# Patient Record
Sex: Female | Born: 1990 | Hispanic: Yes | Marital: Married | Smoking: Never smoker
Health system: Southern US, Community
[De-identification: ages and names within clinical notes are randomized; demographics above are authoritative.]

## PROBLEM LIST (undated history)

## (undated) DIAGNOSIS — Z789 Other specified health status: Secondary | ICD-10-CM

## (undated) DIAGNOSIS — O91119 Abscess of breast associated with pregnancy, unspecified trimester: Secondary | ICD-10-CM

## (undated) HISTORY — PX: NO PAST SURGERIES: SHX2092

## (undated) HISTORY — DX: Abscess of breast associated with pregnancy, unspecified trimester: O91.119

---

## 2015-01-07 NOTE — L&D Delivery Note (Signed)
Delivery Note At 1:24 AM a viable female was delivered via Vaginal, Spontaneous Delivery (Presentation: Right Occiput Anterior).  APGAR: 7, 9; weight 2682g .   Placenta status: Intact, Spontaneous.  Cord: 3 vessels with the following complications: None.  Cord pH: n/a  Anesthesia: Epidural  Episiotomy: None Lacerations: 1st degree;Perineal; right labia minora Suture Repair: 3.0 vicryl for perineal, 4.0 vicryl for labial repair Est. Blood Loss (mL): 250  Mom to postpartum.  Baby to Couplet care / Skin to Skin.  Palma HolterKanishka G Treylin Burtch 07/11/2015, 1:55 AM

## 2015-03-05 LAB — OB RESULTS CONSOLE HEPATITIS B SURFACE ANTIGEN: Hepatitis B Surface Ag: NEGATIVE

## 2015-03-05 LAB — OB RESULTS CONSOLE RPR: RPR: NONREACTIVE

## 2015-03-05 LAB — OB RESULTS CONSOLE HIV ANTIBODY (ROUTINE TESTING): HIV: NONREACTIVE

## 2015-07-10 ENCOUNTER — Inpatient Hospital Stay (HOSPITAL_COMMUNITY): Payer: Medicaid Other | Admitting: Anesthesiology

## 2015-07-10 ENCOUNTER — Encounter (HOSPITAL_COMMUNITY): Payer: Self-pay

## 2015-07-10 ENCOUNTER — Inpatient Hospital Stay (HOSPITAL_COMMUNITY)
Admission: AD | Admit: 2015-07-10 | Discharge: 2015-07-13 | DRG: 775 | Disposition: A | Discharge status: Home / Self Care | Payer: Medicaid Other | Source: Ambulatory Visit | Attending: Obstetrics & Gynecology | Admitting: Obstetrics & Gynecology

## 2015-07-10 DIAGNOSIS — Z6830 Body mass index (BMI) 30.0-30.9, adult: Secondary | ICD-10-CM | POA: Diagnosis not present

## 2015-07-10 DIAGNOSIS — O42013 Preterm premature rupture of membranes, onset of labor within 24 hours of rupture, third trimester: Secondary | ICD-10-CM | POA: Diagnosis present

## 2015-07-10 DIAGNOSIS — Z3A36 36 weeks gestation of pregnancy: Secondary | ICD-10-CM

## 2015-07-10 DIAGNOSIS — Z87891 Personal history of nicotine dependence: Secondary | ICD-10-CM

## 2015-07-10 DIAGNOSIS — E669 Obesity, unspecified: Secondary | ICD-10-CM | POA: Diagnosis present

## 2015-07-10 DIAGNOSIS — O42919 Preterm premature rupture of membranes, unspecified as to length of time between rupture and onset of labor, unspecified trimester: Secondary | ICD-10-CM | POA: Diagnosis present

## 2015-07-10 DIAGNOSIS — O99214 Obesity complicating childbirth: Secondary | ICD-10-CM | POA: Diagnosis present

## 2015-07-10 DIAGNOSIS — O99824 Streptococcus B carrier state complicating childbirth: Secondary | ICD-10-CM | POA: Diagnosis present

## 2015-07-10 HISTORY — DX: Other specified health status: Z78.9

## 2015-07-10 LAB — CBC
HEMATOCRIT: 39.4 % (ref 36.0–46.0)
HEMOGLOBIN: 13.7 g/dL (ref 12.0–15.0)
MCH: 30 pg (ref 26.0–34.0)
MCHC: 34.8 g/dL (ref 30.0–36.0)
MCV: 86.4 fL (ref 78.0–100.0)
Platelets: 251 10*3/uL (ref 150–400)
RBC: 4.56 MIL/uL (ref 3.87–5.11)
RDW: 15.3 % (ref 11.5–15.5)
WBC: 8.8 10*3/uL (ref 4.0–10.5)

## 2015-07-10 LAB — OB RESULTS CONSOLE RUBELLA ANTIBODY, IGM: RUBELLA: IMMUNE

## 2015-07-10 LAB — OB RESULTS CONSOLE RPR: RPR: NONREACTIVE

## 2015-07-10 LAB — OB RESULTS CONSOLE ABO/RH: RH TYPE: POSITIVE

## 2015-07-10 LAB — ABO/RH: ABO/RH(D): O POS

## 2015-07-10 LAB — POCT FERN TEST: POCT FERN TEST: POSITIVE

## 2015-07-10 LAB — TYPE AND SCREEN
ABO/RH(D): O POS
ANTIBODY SCREEN: NEGATIVE

## 2015-07-10 LAB — OB RESULTS CONSOLE HIV ANTIBODY (ROUTINE TESTING): HIV: NONREACTIVE

## 2015-07-10 MED ORDER — FENTANYL 2.5 MCG/ML BUPIVACAINE 1/10 % EPIDURAL INFUSION (WH - ANES)
INTRAMUSCULAR | Status: AC
  Filled 2015-07-10: qty 125

## 2015-07-10 MED ORDER — OXYTOCIN BOLUS FROM INFUSION: 500.0000 mL | INTRAVENOUS | Status: DC

## 2015-07-10 MED ORDER — TERBUTALINE SULFATE 1 MG/ML IJ SOLN
0.2500 mg | Freq: Once | INTRAMUSCULAR | Status: DC | PRN
  Filled 2015-07-10: qty 1

## 2015-07-10 MED ORDER — ACETAMINOPHEN 325 MG PO TABS: 650.0000 mg | ORAL_TABLET | ORAL | Status: DC | PRN

## 2015-07-10 MED ORDER — LACTATED RINGERS IV SOLN: 500.0000 mL | INTRAVENOUS | Status: DC | PRN

## 2015-07-10 MED ORDER — OXYTOCIN 40 UNITS IN LACTATED RINGERS INFUSION - SIMPLE MED: 2.5000 [IU]/h | INTRAVENOUS | Status: DC

## 2015-07-10 MED ORDER — CEFAZOLIN IN D5W 1 GM/50ML IV SOLN
1.0000 g | Freq: Three times a day (TID) | INTRAVENOUS | Status: DC
  Administered 2015-07-10 (×2): 1 g via INTRAVENOUS
  Filled 2015-07-10 (×4): qty 50

## 2015-07-10 MED ORDER — FENTANYL 2.5 MCG/ML BUPIVACAINE 1/10 % EPIDURAL INFUSION (WH - ANES)
14.0000 mL/h | INTRAMUSCULAR | Status: DC | PRN
  Administered 2015-07-10: 11 mL/h via EPIDURAL

## 2015-07-10 MED ORDER — LACTATED RINGERS IV SOLN
500.0000 mL | Freq: Once | INTRAVENOUS | Status: DC
Start: 2015-07-10 — End: 2015-07-11

## 2015-07-10 MED ORDER — EPHEDRINE 5 MG/ML INJ
10.0000 mg | INTRAVENOUS | Status: DC | PRN
Start: 2015-07-10 — End: 2015-07-11
  Filled 2015-07-10: qty 2

## 2015-07-10 MED ORDER — LACTATED RINGERS IV SOLN
INTRAVENOUS | Status: DC
  Administered 2015-07-10: 1000 mL via INTRAVENOUS
  Administered 2015-07-10: 23:00:00 via INTRAVENOUS

## 2015-07-10 MED ORDER — ONDANSETRON HCL 4 MG/2ML IJ SOLN: 4.0000 mg | Freq: Four times a day (QID) | INTRAMUSCULAR | Status: DC | PRN

## 2015-07-10 MED ORDER — LIDOCAINE HCL (PF) 1 % IJ SOLN
30.0000 mL | INTRAMUSCULAR | Status: AC | PRN
  Administered 2015-07-11: 30 mL via SUBCUTANEOUS
  Filled 2015-07-10: qty 30

## 2015-07-10 MED ORDER — LIDOCAINE HCL (PF) 1 % IJ SOLN
INTRAMUSCULAR | Status: DC | PRN
  Administered 2015-07-10: 3 mL via EPIDURAL
  Administered 2015-07-10: 4 mL via EPIDURAL

## 2015-07-10 MED ORDER — SOD CITRATE-CITRIC ACID 500-334 MG/5ML PO SOLN: 30.0000 mL | ORAL | Status: DC | PRN

## 2015-07-10 MED ORDER — PHENYLEPHRINE 40 MCG/ML (10ML) SYRINGE FOR IV PUSH (FOR BLOOD PRESSURE SUPPORT)
PREFILLED_SYRINGE | INTRAVENOUS | Status: DC
Start: 2015-07-10 — End: 2015-07-11
  Filled 2015-07-10: qty 20

## 2015-07-10 MED ORDER — BETAMETHASONE SOD PHOS & ACET 6 (3-3) MG/ML IJ SUSP
12.0000 mg | INTRAMUSCULAR | Status: DC
  Administered 2015-07-10: 12 mg via INTRAMUSCULAR
  Filled 2015-07-10: qty 2

## 2015-07-10 MED ORDER — DIPHENHYDRAMINE HCL 50 MG/ML IJ SOLN: 12.5000 mg | INTRAMUSCULAR | Status: DC | PRN

## 2015-07-10 MED ORDER — PHENYLEPHRINE 40 MCG/ML (10ML) SYRINGE FOR IV PUSH (FOR BLOOD PRESSURE SUPPORT)
80.0000 ug | PREFILLED_SYRINGE | INTRAVENOUS | Status: DC | PRN
Start: 2015-07-10 — End: 2015-07-11
  Filled 2015-07-10: qty 5

## 2015-07-10 MED ORDER — EPHEDRINE 5 MG/ML INJ
10.0000 mg | INTRAVENOUS | Status: DC | PRN
  Filled 2015-07-10: qty 2

## 2015-07-10 MED ORDER — PHENYLEPHRINE 40 MCG/ML (10ML) SYRINGE FOR IV PUSH (FOR BLOOD PRESSURE SUPPORT)
80.0000 ug | PREFILLED_SYRINGE | INTRAVENOUS | Status: DC | PRN
  Filled 2015-07-10: qty 5

## 2015-07-10 MED ORDER — OXYTOCIN 40 UNITS IN LACTATED RINGERS INFUSION - SIMPLE MED
1.0000 m[IU]/min | INTRAVENOUS | Status: DC
  Administered 2015-07-10: 2 m[IU]/min via INTRAVENOUS
  Administered 2015-07-10: 4 m[IU]/min via INTRAVENOUS
  Filled 2015-07-10: qty 1000

## 2015-07-10 MED ORDER — CEFAZOLIN SODIUM-DEXTROSE 2-4 GM/100ML-% IV SOLN: 2.0000 g | Freq: Once | INTRAVENOUS | Status: DC

## 2015-07-10 NOTE — Anesthesia Preprocedure Evaluation (Signed)
Anesthesia Evaluation  Patient identified by MRN, date of birth, ID band Patient awake    Reviewed: Allergy & Precautions, H&P , Patient's Chart, lab work & pertinent test results  Airway Mallampati: III  TM Distance: >3 FB Neck ROM: full    Dental no notable dental hx. (+) Teeth Intact   Pulmonary neg pulmonary ROS, former smoker,    Pulmonary exam normal breath sounds clear to auscultation       Cardiovascular negative cardio ROS Normal cardiovascular exam Rhythm:regular Rate:Normal     Neuro/Psych negative neurological ROS  negative psych ROS   GI/Hepatic negative GI ROS, Neg liver ROS,   Endo/Other  Obesity  Renal/GU negative Renal ROS  negative genitourinary   Musculoskeletal   Abdominal   Peds  Hematology   Anesthesia Other Findings   Reproductive/Obstetrics (+) Pregnancy                             Anesthesia Physical Anesthesia Plan  ASA: II  Anesthesia Plan: Epidural   Post-op Pain Management:    Induction:   Airway Management Planned:   Additional Equipment:   Intra-op Plan:   Post-operative Plan:   Informed Consent: I have reviewed the patients History and Physical, chart, labs and discussed the procedure including the risks, benefits and alternatives for the proposed anesthesia with the patient or authorized representative who has indicated his/her understanding and acceptance.     Plan Discussed with: Anesthesiologist  Anesthesia Plan Comments:         Anesthesia Quick Evaluation

## 2015-07-10 NOTE — Anesthesia Pain Management Evaluation Note (Signed)
  CRNA Pain Management Visit Note  Patient: Sherri [name], 25 y.o., female  "Hello I am a member of the anesthesia team at [facility]. We have an anesthesia team available at all times to provide care throughout the hospital, including epidural management and anesthesia for C-section. I don't know your plan for the delivery whether it a natural birth, water birth, IV sedation, nitrous supplementation, doula or epidural, but we want to meet your pain goals."   1.Was your pain managed to your expectations on prior hospitalizations?   No prior hospitalizations  2.What is your expectation for pain management during this hospitalization?     Labor support without medications  3.How can we help you reach that goal? Natural   Record the patient's initial score and the patient's pain goal.   Pain: 2  Pain Goal: 10 [facility] wants you to be able to say your pain was always managed very well.  Aymar Whitfill 07/10/2015

## 2015-07-10 NOTE — MAU Note (Signed)
Started leaking fluid at 3am this morning.  No pain

## 2015-07-10 NOTE — H&P (Signed)
LABOR AND DELIVERY ADMISSION HISTORY AND PHYSICAL NOTE  Sherri [name] is a 25 y.o. female G1P0 with IUP at 5462w5d by LMP presenting for PPROM at 430am 7/4.   She reports positive fetal movement. She denies leakage of fluid or vaginal bleeding.  Prenatal History/Complications:  Past Medical History: History reviewed. No pertinent past medical history.  Past Surgical History: History reviewed. No pertinent past surgical history.  Obstetrical History: OB History    Gravida Para Term Preterm AB TAB SAB Ectopic Multiple Living   1               Social History: Social History   Social History  . Marital Status: Single    Spouse Name: N/A  . Number of Children: N/A  . Years of Education: N/A   Social History Main Topics  . Smoking status: Former Games developermoker  . Smokeless tobacco: None  . Alcohol Use: No  . Drug Use: No  . Sexual Activity: Yes    Birth Control/ Protection: None   Other Topics Concern  . None   Social History Narrative  . None    Family History: History reviewed. No pertinent family history.  Allergies: Allergies  Allergen Reactions  . Pork-Derived Conservation officer, natureroducts Hives  . Penicillins Rash    Has patient had a PCN reaction causing immediate rash, facial/tongue/throat swelling, SOB or lightheadedness with hypotension: No Has patient had a PCN reaction causing severe rash involving mucus membranes or skin necrosis: No Has patient had a PCN reaction that required hospitalization No Has patient had a PCN reaction occurring within the last 10 years: No If all of the above answers are "NO", then may proceed with Cephalosporin use.     Prescriptions prior to admission  Medication Sig Dispense Refill Last Dose  . Prenatal Vit-Fe Fumarate-FA (PRENATAL MULTIVITAMIN) TABS tablet Take 1 tablet by mouth daily at 12 noon.   Past Week at Unknown time     Review of Systems   All systems reviewed and negative except as stated in HPI  Blood pressure 117/77, pulse  94, temperature 97.8 F (36.6 C), temperature source Oral, resp. rate 16, last menstrual period 10/26/2014. General appearance: alert, cooperative and no distress Lungs: clear to auscultation bilaterally Heart: regular rate and rhythm Abdomen: soft, non-tender; bowel sounds normal Extremities: No calf swelling or tenderness Presentation: cephalic by bedside US Fetal monitoring: reassuring category I strip Uterine activity: irregular contractions  Dilation: 1.5 Effacement (%): 50 Exam by:: Ginnie Smartachel Schmidt RN   Prenatal labs: ABO, Rh:   O pos Antibody:  negative Rubella: !Error! immune RPR:   negative HBsAg:   negative HIV:   nonreactive GBS:   unknown 1 hr Glucola: not recorded in [facility] records Genetic screening:  normal Anatomy US: 19 wk US referenced in [facility] records, no results reported  Prenatal Transfer Tool  Maternal Diabetes: No Genetic Screening: Normal Maternal Ultrasounds/Referrals: Normal Fetal Ultrasounds or other Referrals:  None Maternal Substance Abuse:  No Significant Maternal Medications:  None Significant Maternal Lab Results: Lab values include: Group B Strep positive, Other: GBS unknown, history of anemia  Results for orders placed or performed during the hospital encounter of 07/10/15 (from the past 24 hour(s))  Fern Test   Collection Time: 07/10/15  3:01 PM  Result Value Ref Range   POCT Fern Test Positive = ruptured amniotic membanes     Patient Active Problem List   Diagnosis Date Noted  . Preterm premature rupture of membranes (PPROM) delivered, current hospitalization 07/10/2015  Assessment: Sherri [name] is a 25 y.o. G1P0 at 6942w5d here for PPROM since 46430 7/4.   #Labor: foley bulb, pit #Pain: None at admission, undecided on pain relief plans #FWB: Reassuring category I strip #ID:  GBS unk - ancef #MOF: breast #MOC:undecided #Circ:  Undecided #pprom: bmz now and in 24 hours, will hold if triple i diagnosed  Loni MuseKate  Timberlake 07/10/2015, 3:55 PM    OB FELLOW HISTORY AND PHYSICAL ATTESTATION  I have seen and examined this patient; I agree with above documentation in the resident's note.    Cherrie Gauzeoah B Danelia Snodgrass 07/10/2015, 4:26 PM

## 2015-07-10 NOTE — Anesthesia Procedure Notes (Signed)
Epidural Patient location during procedure: OB Start time: 07/10/2015 11:02 PM  Staffing Anesthesiologist: Mal AmabileFOSTER, Khair Chasteen Performed by: anesthesiologist   Preanesthetic Checklist Completed: patient identified, site marked, surgical consent, pre-op evaluation, timeout performed, IV checked, risks and benefits discussed and monitors and equipment checked  Epidural Patient position: sitting Prep: site prepped and draped and DuraPrep Patient monitoring: continuous pulse ox and blood pressure Approach: midline Location: L3-L4 Injection technique: LOR air  Needle:  Needle type: Tuohy  Needle gauge: 17 G Needle length: 9 cm and 9 Needle insertion depth: 5 cm cm Catheter type: closed end flexible Catheter size: 19 Gauge Catheter at skin depth: 10 cm Test dose: negative and Other  Assessment Events: blood not aspirated, injection not painful, no injection resistance, negative IV test and no paresthesia  Additional Notes Patient identified. Risks and benefits discussed including failed block, incomplete  Pain control, post dural puncture headache, nerve damage, paralysis, blood pressure Changes, nausea, vomiting, reactions to medications-both toxic and allergic and post Partum back pain. All questions were answered. Patient expressed understanding and wished to proceed. Sterile technique was used throughout procedure. Epidural site was Dressed with sterile barrier dressing. No paresthesias, signs of intravascular injection Or signs of intrathecal spread were encountered.  Patient was more comfortable after the epidural was dosed. Please see RN's note for documentation of vital signs and FHR which are stable.

## 2015-07-10 NOTE — Progress Notes (Signed)
Interpreter in room

## 2015-07-10 NOTE — Progress Notes (Signed)
Labor Progress Note Sherri [name] is a 25 y.o. G1P0 at 2244w5d presented for PPROM since 0430 7/4.    O:  BP 120/82 mmHg  Pulse 86  Temp(Src) 98.7 F (37.1 C) (Oral)  Resp 18  Ht 5' 0.24" (1.53 m)  Wt 71.668 kg (158 lb)  BMI 30.62 kg/m2  LMP 10/26/2014 (Exact Date) EFM: HR 135, mod variability, one mild variable, 15x15 accels  CVE: Dilation: 4.5 Effacement (%): 70 Cervical Position: Middle Station: -2 Presentation: Vertex Exam by:: Sherri ShirtsV Rogers RN    A&P: 25 y.o. G1P0 3944w5d PPROM at 530430 7/4 #Labor: s/p foley bulb; on pitocin  #Pain: does not desire medications  #FWB: cat 1 #GBS: unknown > Ancef due to allergy to PCN  Palma HolterKanishka G Gunadasa, MD 10:30 PM

## 2015-07-10 NOTE — Progress Notes (Signed)
Dr Ashok PallWouk [place], procedures to pt.

## 2015-07-11 ENCOUNTER — Encounter (HOSPITAL_COMMUNITY): Payer: Self-pay | Admitting: Certified Nurse Midwife

## 2015-07-11 DIAGNOSIS — Z3A36 36 weeks gestation of pregnancy: Secondary | ICD-10-CM

## 2015-07-11 DIAGNOSIS — O42013 Preterm premature rupture of membranes, onset of labor within 24 hours of rupture, third trimester: Secondary | ICD-10-CM

## 2015-07-11 MED ORDER — ACETAMINOPHEN 325 MG PO TABS: 650.0000 mg | ORAL_TABLET | ORAL | Status: DC | PRN

## 2015-07-11 MED ORDER — WITCH HAZEL-GLYCERIN EX PADS: 1.0000 "application " | MEDICATED_PAD | CUTANEOUS | Status: DC | PRN

## 2015-07-11 MED ORDER — ZOLPIDEM TARTRATE 5 MG PO TABS: 5.0000 mg | ORAL_TABLET | Freq: Every evening | ORAL | Status: DC | PRN

## 2015-07-11 MED ORDER — COCONUT OIL OIL: 1.0000 "application " | TOPICAL_OIL | Status: DC | PRN

## 2015-07-11 MED ORDER — TETANUS-DIPHTH-ACELL PERTUSSIS 5-2.5-18.5 LF-MCG/0.5 IM SUSP
0.5000 mL | Freq: Once | INTRAMUSCULAR | Status: AC
  Administered 2015-07-12: 0.5 mL via INTRAMUSCULAR
  Filled 2015-07-11: qty 0.5

## 2015-07-11 MED ORDER — ONDANSETRON HCL 4 MG/2ML IJ SOLN: 4.0000 mg | INTRAMUSCULAR | Status: DC | PRN

## 2015-07-11 MED ORDER — PRENATAL MULTIVITAMIN CH
1.0000 | ORAL_TABLET | Freq: Every day | ORAL | Status: DC
  Administered 2015-07-11 – 2015-07-13 (×3): 1 via ORAL
  Filled 2015-07-11 (×3): qty 1

## 2015-07-11 MED ORDER — ONDANSETRON HCL 4 MG PO TABS: 4.0000 mg | ORAL_TABLET | ORAL | Status: DC | PRN

## 2015-07-11 MED ORDER — DIBUCAINE 1 % RE OINT: 1.0000 "application " | TOPICAL_OINTMENT | RECTAL | Status: DC | PRN

## 2015-07-11 MED ORDER — SENNOSIDES-DOCUSATE SODIUM 8.6-50 MG PO TABS
2.0000 | ORAL_TABLET | ORAL | Status: DC
  Administered 2015-07-12 (×2): 2 via ORAL
  Filled 2015-07-11 (×2): qty 2

## 2015-07-11 MED ORDER — DIPHENHYDRAMINE HCL 25 MG PO CAPS: 25.0000 mg | ORAL_CAPSULE | Freq: Four times a day (QID) | ORAL | Status: DC | PRN

## 2015-07-11 MED ORDER — IBUPROFEN 600 MG PO TABS
600.0000 mg | ORAL_TABLET | Freq: Four times a day (QID) | ORAL | Status: DC
  Administered 2015-07-11 – 2015-07-13 (×10): 600 mg via ORAL
  Filled 2015-07-11 (×10): qty 1

## 2015-07-11 MED ORDER — SIMETHICONE 80 MG PO CHEW: 80.0000 mg | CHEWABLE_TABLET | ORAL | Status: DC | PRN

## 2015-07-11 MED ORDER — BENZOCAINE-MENTHOL 20-0.5 % EX AERO
1.0000 "application " | INHALATION_SPRAY | CUTANEOUS | Status: DC | PRN
  Filled 2015-07-11: qty 56

## 2015-07-11 NOTE — Lactation Note (Signed)
This note was copied from a baby's chart. Lactation Consultation Note  Patient Name: Sherri [name] YNWGN'FToday's Date: 07/11/2015 Reason for consult: Initial assessment;Infant < 6lbs;Late preterm infant Baby sleepy at this visit. Attempted to wake to latch but baby too sleepy, would take few suckles on LC finger then fall asleep. Placed baby STS on Mom. Basic teaching reviewed with parents. LPT policy discussed and hand out given to review. Encouraged to BF with feeding ques but at least every 3 hours. Attempted hand expression at this visit, received 1 drop of colostrum. Baby had 2 ml of colostrum around 12:00 per chart. Lactation brochure left for review. Advised of OP services and support group. Will return around 1500 to attempt latch again, Mom to leave baby  STS till that time. If baby not waking to BF and not able to obtain colostrum via hand expression/pump, discussed with parents supplementing according to LPT guidelines. [employer] [phone] used for visit.   Maternal Data Has patient been taught Hand Expression?: Yes Does the patient have breastfeeding experience prior to this delivery?: No  Feeding Feeding Type: Breast Fed Length of feed: 0 min  LATCH Score/Interventions Latch: Too sleepy or reluctant, no latch achieved, no sucking elicited.  Audible Swallowing: None  Type of Nipple: Everted at rest and after stimulation  Comfort (Breast/Nipple): Soft / non-tender     Hold (Positioning): Assistance needed to correctly position infant at breast and maintain latch. Intervention(s): Breastfeeding basics reviewed;Support Pillows;Position options;Skin to skin  LATCH Score: 5  Lactation Tools Discussed/Used Tools: Pump Breast pump type: Double-Electric Breast Pump WIC Program: Yes Pump Review: Setup, frequency, and cleaning Initiated by:: LC Date initiated:: 07/11/15   Consult Status Consult Status: Follow-up Date: 07/11/15 Follow-up type:  In-patient    Sherri [name], Sherri [name] 07/11/2015, 2:09 PM

## 2015-07-11 NOTE — Lactation Note (Signed)
This note was copied from a baby's chart. Lactation Consultation Note  Patient Name: Sherri Orson AloeMergeri Diaz Barrios IONGE'XToday's Date: 07/11/2015 Reason for consult: Follow-up assessment;Infant < 6lbs;Late preterm infant [employer] [phone] - Mom pre-pumped with hand pump to help with latch, small amount of aerola edema present. LC demonstrated awakening techniques, assisted Mom with positioning and latching baby to left breast. After few attempts and some suck training baby latched and developed a good suckling rhythm, swallowing motions noted. After the feeding, assisted Mom with hand expression and finger feeding baby back approx 1 ml of colostrum. Discussed supplementing since baby sleepy and LPT, demonstrated to FOB how to finger feed using curved tipped syringe, FOB demonstrated back but reports he would feel better using a bottle. Advised to call RN for assist with 1st bottle after next BF. Encouraged supplementing due to LPT and baby very sleepy at breast, <6 lbs. Plan discussed with Mom is to BF each feeding 1 breast for up to 30 minutes, FOB to give supplement per LPT guidelines while Mom post pumps for 15 minutes. Alternate breast each feeding.  Parents report understanding plan. RN aware of plan discussed with parents.  Encouraged to call for assist with feedings, wake baby to BF if not giving feeding ques by 3 hours from last feeding. Keep baby STS as much as Mom can. Call for assist as needed, questions/concerns.   Maternal Data Has patient been taught Hand Expression?: Yes Does the patient have breastfeeding experience prior to this delivery?: No  Feeding Feeding Type: Formula Length of feed: 5 min  LATCH Score/Interventions Latch: Repeated attempts needed to sustain latch, nipple held in mouth throughout feeding, stimulation needed to elicit sucking reflex. Intervention(s): Adjust position;Assist with latch;Breast massage;Breast compression  Audible Swallowing: A few with  stimulation  Type of Nipple: Everted at rest and after stimulation  Comfort (Breast/Nipple): Soft / non-tender     Hold (Positioning): Assistance needed to correctly position infant at breast and maintain latch. Intervention(s): Breastfeeding basics reviewed;Support Pillows;Position options;Skin to skin  LATCH Score: 7  Lactation Tools Discussed/Used Tools: Pump Breast pump type: Double-Electric Breast Pump WIC Program: Yes Pump Review: Setup, frequency, and cleaning Initiated by:: RN Date initiated:: 07/11/15   Consult Status Consult Status: Follow-up Date: 07/12/15 Follow-up type: In-patient    Alfred LevinsGranger, Denisia Harpole Ann 07/11/2015, 3:55 PM

## 2015-07-11 NOTE — Progress Notes (Signed)
I stopped to check on patients need I oredered her meals, by Orlan LeavensViria Alvarez Spanish Interpreter.

## 2015-07-11 NOTE — Anesthesia Postprocedure Evaluation (Signed)
Anesthesia Post Note  Patient: Sherri [name]  Procedure(s) Performed: * No procedures listed *  Patient location during evaluation: Mother Baby Anesthesia Type: Epidural Level of consciousness: awake and alert Pain management: pain level controlled Vital Signs Assessment: post-procedure vital signs reviewed and stable Respiratory status: spontaneous breathing, nonlabored ventilation and respiratory function stable Cardiovascular status: stable Postop Assessment: no headache, no backache and epidural receding Anesthetic complications: no     Last Vitals:  Filed Vitals:   07/11/15 0420 07/11/15 0800  BP: 110/70 107/59  Pulse: 110 96  Temp: 37.1 C 36.9 C  Resp: 18 18    Last Pain:  Filed Vitals:   07/11/15 0849  PainSc: 0-No pain   Pain Goal:                 Junious SilkGILBERT,Norell Brisbin

## 2015-07-12 LAB — RPR: RPR Ser Ql: NONREACTIVE

## 2015-07-12 NOTE — Progress Notes (Signed)
Post Partum Day 1  Subjective:  Sherri [name] is a 25 y.o. G1P0101 8475w6d s/p SVD.  No acute events overnight.  Pt denies problems with ambulating, voiding or po intake.  She denies nausea or vomiting.  Pain is well controlled.  She has had flatus. She has not had bowel movement.  Lochia Small.  Plan for birth control is IUD.  Method of Feeding: breast  Objective: BP 109/67 mmHg  Pulse 98  Temp(Src) 97.9 F (36.6 C) (Oral)  Resp 18  Ht 5' 0.24" (1.53 m)  Wt 71.668 kg (158 lb)  BMI 30.62 kg/m2  SpO2 99%  LMP 10/26/2014 (Exact Date)  Breastfeeding? Unknown  Physical Exam:  General: alert, cooperative and no distress Lochia:normal flow Chest: CTAB Heart: RRR no m/r/g Abdomen: +BS, soft, nontender, fundus firm at/below umbilicus Uterine Fundus: firm, nontender DVT Evaluation: No evidence of DVT seen on physical exam. Extremities: no edema   Recent Labs  07/10/15 1633  HGB 13.7  HCT 39.4    Assessment/Plan:  ASSESSMENT: Sherri [name] is a 25 y.o. G1P0101 5975w6d ppd #1 s/p NSVD doing well.   Plan for discharge tomorrow and Breastfeeding   LOS: 2 days   Loni MuseKate Timberlake 07/12/2015, 8:02 AM    CNM attestation Post Partum Day #1 I have seen and examined this patient and agree with above documentation in the resident's note.   Sherri [name] is a 25 y.o. G1P0101 s/p SVD.  Pt denies problems with ambulating, voiding or po intake. Pain is well controlled.  Plan for birth control is IUD.  Method of Feeding: breast  PE:  BP 109/67 mmHg  Pulse 98  Temp(Src) 97.9 F (36.6 C) (Oral)  Resp 18  Ht 5' 0.24" (1.53 m)  Wt 71.668 kg (158 lb)  BMI 30.62 kg/m2  SpO2 99%  LMP 10/26/2014 (Exact Date)  Breastfeeding? Unknown Fundus firm  Plan for discharge: 07/13/15  Cam HaiSHAW, Brittain Smithey, CNM 9:15 AM  07/12/2015

## 2015-07-12 NOTE — Progress Notes (Signed)
I assisted FP with explanation of care plan.  Sherri [name]  Interpreter. °

## 2015-07-13 MED ORDER — IBUPROFEN 600 MG PO TABS: 600.0000 mg | ORAL_TABLET | Freq: Four times a day (QID) | ORAL | Status: DC

## 2015-07-13 MED ORDER — ACETAMINOPHEN 325 MG PO TABS: 650.0000 mg | ORAL_TABLET | ORAL | Status: DC | PRN

## 2015-07-13 NOTE — Lactation Note (Signed)
This note was copied from a baby's chart. Lactation Consultation Note Moms breast are filling. Massage breast and hand expressed easily colostrum.set up DEBP to relieve filling. Collected 5 colostrum. Gave to baby first, then formula subtract according to LPI feeding sheet,. Interpreter present. Parents states understand.  Patient Name: Sherri Orson AloeMergeri Diaz Barrios JWJXB'JToday's Date: 07/13/2015 Reason for consult: Follow-up assessment;Infant < 6lbs;Late preterm infant   Maternal Data    Feeding Feeding Type: Breast Milk with Formula added Nipple Type: Slow - flow  LATCH Score/Interventions Intervention(s): Adjust position;Assist with latch;Breast massage;Breast compression  Intervention(s): Skin to skin;Hand expression Intervention(s): Alternate breast massage  Type of Nipple: Everted at rest and after stimulation  Comfort (Breast/Nipple): Soft / non-tender     Intervention(s): Position options;Support Pillows;Breastfeeding basics reviewed;Skin to skin     Lactation Tools Discussed/Used Tools: Pump;Bottle Breast pump type: Double-Electric Breast Pump   Consult Status Consult Status: Follow-up Date: 07/14/15 Follow-up type: In-patient    Charyl DancerCARVER, Rhyan Wolters G 07/13/2015, 6:21 AM

## 2015-07-13 NOTE — Lactation Note (Signed)
This note was copied from a baby's chart. Lactation Consultation Note Baby 51 hrs old had 3% weight loss. Mom BF first then giving formula as supplement. Had good I&O. FOB states mom has a little bit more colostrum, but not enough to BF baby w/o formula. Baby had 10 voids and 11 stools since birth. Interpreter to assist in question for me.  Patient Name: Sherri [name] Reason for consult: Follow-up assessment;Infant < 6lbs;Late preterm infant   Maternal Data    Feeding    LATCH Score/Interventions                      Lactation Tools Discussed/Used     Consult Status Consult Status: Follow-up Date: 07/14/15 Follow-up type: In-patient    [name], [name], 5:28 AM

## 2015-07-13 NOTE — Lactation Note (Signed)
This note was copied from a baby's chart. Lactation Consultation Note  [employer]da Interpreter present. Mother states she is pumping in addition to formula feeding because baby is still having difficulty latching. She recently pumped 8 ml and gave to baby with an additional 20 ml of formula. Parents state they have volume guidelines. Encouraged mother to continue trying to breastfeed and pump. Suggest she call if she would like assistance.  Patient Name: Sherri [name]: 07/13/2015 Reason for consult: Follow-up assessment   Maternal Data    Feeding Feeding Type: Breast Milk with Formula added Nipple Type: Slow - flow  LATCH Score/Interventions                      Lactation Tools Discussed/Used     Consult Status Consult Status: Follow-up [name]: 07/14/15 Follow-up type: In-patient    [name], [name] 07/13/2015, 1:22 PM

## 2015-07-13 NOTE — Discharge Summary (Signed)
OB Discharge Summary     Patient Name: Sherri [name] DOB: [birth year] MRN: [id number]  Date of admission: 07/10/2015 Delivering MD: Palma HolterGUNADASA, KANISHKA G   Date of discharge: 07/13/2015  Admitting diagnosis: 36WKS LEAKING FLUID Intrauterine pregnancy: 4847w6d     Secondary diagnosis:  Active Problems:   Preterm premature rupture of membranes (PPROM) delivered, current hospitalization  Additional problems: none     Discharge diagnosis: Term Pregnancy Delivered                                                                                                Post partum procedures: none  Augmentation: Pitocin and Foley Balloon  Complications: None  Hospital course:  Onset of Labor With Vaginal Delivery     25 y.o. yo G1P0101 at 8447w6d was admitted with prom on 07/10/2015. Patient had an uncomplicated labor course as follows:  Membrane Rupture Time/Date: 4:30 PM ,07/11/2015   Intrapartum Procedures: Episiotomy: None [1]                                         Lacerations:  1st degree [2];Perineal [11]  Patient had a delivery of a Viable infant. 07/11/2015  Information for the patient's newborn:  Oretha EllisDiaz [name], Boy Sherri [name] [[id number]][[id number]]  Delivery Method: Vaginal, Spontaneous Delivery (Filed from Delivery Summary)    Pateint had an uncomplicated postpartum course.  She is ambulating, tolerating a regular diet, passing flatus, and urinating well. Patient is discharged home in stable condition on 07/13/2015.    Physical exam  Filed Vitals:   07/11/15 1830 07/12/15 0658 07/12/15 1810 07/13/15 0600  BP: 102/63 109/67 108/60 100/71  Pulse: 113 98 107 90  Temp: 97.9 F (36.6 C) 97.9 F (36.6 C) 98.2 F (36.8 C) 98.2 F (36.8 C)  TempSrc: Oral Oral Oral   Resp: 18 18 16 18   Height:      Weight:      SpO2: 99%  100%    General: alert, cooperative and no distress Lochia: appropriate Uterine Fundus: firm Incision: N/A DVT Evaluation: No cords or calf tenderness. No significant  calf/ankle edema. Labs: Lab Results  Component Value Date   WBC 8.8 07/10/2015   HGB 13.7 07/10/2015   HCT 39.4 07/10/2015   MCV 86.4 07/10/2015   PLT 251 07/10/2015   No flowsheet data found.  Discharge instruction: per After Visit Summary and "Baby and Me Booklet".  After visit meds:    Medication List    TAKE these medications        acetaminophen 325 MG tablet  Commonly known as:  TYLENOL  Take 2 tablets (650 mg total) by mouth every 4 (four) hours as needed (for pain scale < 4ORtemperature>/=100.5 F).     ibuprofen 600 MG tablet  Commonly known as:  ADVIL,MOTRIN  Take 1 tablet (600 mg total) by mouth every 6 (six) hours.     prenatal multivitamin Tabs tablet  Take 1 tablet by mouth daily at 12 noon.  Diet: routine diet  Activity: Advance as tolerated. Pelvic rest for 6 weeks.   Outpatient follow up:6 weeks Follow up Appt:No future appointments. Follow up Visit:No Follow-up on file.  Postpartum contraception: Undecided  Newborn Data: Live born female  Birth Weight: 5 lb 14.6 oz (2682 g) APGAR: 7, 9  Baby Feeding: Breast Disposition:home with mother   07/13/2015 Silvano BilisNoah B Chaundra Abreu, MD

## 2015-07-13 NOTE — Discharge Instructions (Signed)
Parto vaginal, Cuidados posteriores  °(Vaginal Delivery, Care After) °Siga estas instrucciones durante las próximas semanas. Estas indicaciones para el alta le proporcionan información general acerca de cómo deberá cuidarse después del parto. El médico también podrá darle instrucciones específicas. El tratamiento ha sido planificado según las prácticas médicas actuales, pero en algunos casos pueden ocurrir problemas. Comuníquese con el médico si tiene algún problema o tiene preguntas al volver a su casa.  °INSTRUCCIONES PARA EL CUIDADO EN EL HOGAR  °· Tome sólo medicamentos de venta libre o recetados, según las indicaciones del médico o del farmacéutico. °· No beba alcohol, especialmente si está amamantando o toma analgésicos. °· No mastique tabaco ni fume. °· No consuma drogas. °· Continúe con un adecuado cuidado perineal. El buen cuidado perineal incluye: °¨ Higienizarse de adelante hacia atrás. °¨ Mantener la zona perineal limpia. °· No use tampones ni duchas vaginales hasta que su médico la autorice. °· Dúchese, lávese el cabello y tome baños de inmersión según las indicaciones de su médico. °· Utilice un sostén que le ajuste bien y que brinde buen soporte a sus mamas. °· Consuma alimentos saludables. °· Beba suficiente líquido para mantener la orina clara o de color amarillo pálido. °· Consuma alimentos ricos en fibra como cereales y panes integrales, arroz, frijoles y frutas y verduras frescas todos los días. Estos alimentos pueden ayudarla a prevenir o aliviar el estreñimiento. °· Siga las recomendaciones de su médico relacionadas con la reanudación de actividades como subir escaleras, conducir automóviles, levantar objetos, hacer ejercicios o viajar. °· Hable con su médico acerca de reanudar la actividad sexual. Volver a la actividad sexual depende del riesgo de infección, la velocidad de la curación y la comodidad y su deseo de reanudarla. °· Trate de que alguien la ayude con las actividades del hogar y con  el recién nacido al menos durante un par de días después de salir del hospital. °· Descanse todo lo que pueda. Trate de descansar o tomar una siesta mientras el bebé está durmiendo. °· Aumente sus actividades gradualmente. °· Cumpla con todas las visitas de control programadas para después del parto. Es muy importante asistir a todas las citas programadas de seguimiento. En estas citas, su médico va a controlarla para asegurarse de que esté sanando física y emocionalmente. °SOLICITE ATENCIÓN MÉDICA SI:  °· Elimina coágulos grandes por la vagina. Guarde algunos coágulos para mostrarle al médico. °· Tiene una secreción con feo olor que proviene de la vagina. °· Tiene dificultad para orinar. °· Orina con frecuencia. °· Siente dolor al orinar. °· Nota un cambio en sus movimientos intestinales. °· Aumenta el enrojecimiento, el dolor o la hinchazón en la zona de la incisión vaginal (episiotomía) o el desgarro vaginal. °· Tiene pus que drena por la episiotomía o el desgarro vaginal. °· La episiotomía o el desgarro vaginal se abren. °· Sus mamas le duelen, están duras o enrojecidas. °· Sufre un dolor intenso de cabeza. °· Tiene visión borrosa o ve manchas. °· Se siente triste o deprimida. °· Tiene pensamientos acerca de lastimarse o dañar al recién nacido. °· Tiene preguntas acerca de su cuidado personal, el cuidado del recién nacido o acerca de los medicamentos. °· Se siente mareada o sufre un desmayo. °· Tiene una erupción. °· Tiene náuseas o vómitos. °· Usted amamantó al bebé y no ha tenido su período menstrual dentro de las 12 semanas después de dejar de amamantar. °· No amamanta al bebé y no tuvo su período menstrual en las últimas 12° semanas después del   parto. °· Tiene fiebre. °SOLICITE ATENCIÓN MÉDICA DE INMEDIATO SI:  °· Siente dolor persistente. °· Siente dolor en el pecho. °· Le falta el aire. °· Se desmaya. °· Siente dolor en la pierna. °· Siente dolor en el estómago. °· El sangrado vaginal satura dos o más  apósitos en 1 hora. °  °Esta información no tiene como fin reemplazar el consejo del médico. Asegúrese de hacerle al médico cualquier pregunta que tenga. °  °Document Released: 12/23/2004 Document Revised: 09/13/2014 °Elsevier Interactive Patient Education ©2016 Elsevier Inc. ° °

## 2015-07-14 ENCOUNTER — Ambulatory Visit: Payer: Self-pay

## 2015-07-14 NOTE — Lactation Note (Signed)
This note was copied from a baby's chart. Lactation Consultation Note  Follow up visit made.  Mom is giving baby a bottle of formula.  Baby is 79 hours and mom's breasts are very full.  She states she last pumped 2 hours ago and obtained 20 mls.  Instructed to change pump setting to standard and pump for 20 minutes.  Instructed to pump every 2-3 hours today.  Reviewed importance of baby receiving breast milk over formula when available.  Instructed to give 30-45 mls per feeding today.  Discussed WIC loaner and mom interested in loaner before discharage.  [facility]  I put pump pieces together and encouraged her to pump when baby finishes bottle.  I will follow up  Patient Name: Boy Deepika Oretha EllisDiaz Barrios ZOXWR'UToday's Date: 07/14/2015     Maternal Data    Feeding    LATCH Score/Interventions                      Lactation Tools Discussed/Used     Consult Status      Huston FoleyMOULDEN, Kristofer Schaffert S 07/14/2015, 9:15 AM

## 2015-07-17 ENCOUNTER — Ambulatory Visit (HOSPITAL_COMMUNITY)
Admission: RE | Admit: 2015-07-17 | Discharge: 2015-07-17 | Disposition: A | Discharge status: Home / Self Care | Payer: Medicaid Other | Source: Ambulatory Visit | Attending: Obstetrics and Gynecology | Admitting: Obstetrics and Gynecology

## 2015-07-17 NOTE — Lactation Note (Signed)
Lactation Consult  Mother's reason for visit:  Help with latch Visit Type:  Outpatient Appointment Notes:  [employer] Video Interpreter [phone]#[id number] [facility] used for [facility]. Mom here for help with latch, she is currently pump and bottle feeding. [facility] but did not bring in today. [facility] now that [facility].  Consult:  Initial Lactation Consultant:  Alfred LevinsGranger, Yeraldine Forney Ann  ________________________________________________________________________  Baby's Name: Carloyn JaegerJowell Abdiel Pez Diaz Date of Birth: 07/11/2013 [facility] Gender: female Gestational Age: [id number] (At Birth) Birth Weight: 5 lb 14.6 oz (2682 g) Weight at Discharge: Weight: 5 lb 9.2 oz (2530 g)Date of Discharge: 07/14/2015 Filed Weights   07/12/15 0003 07/12/15 2350 07/14/15 0105  Weight: 5 lb 9.4 oz (2534 g) 5 lb 9.4 oz (2535 g) 5 lb 9.2 oz (2530 g)   [facility] 07/17/15 5 lb. 12.8 oz Location:Smart start Weight today: 5 lb. 11.8 oz/2602 gm with clean diaper, Baby now 36 days old.      ________________________________________________________________________  Mother's Name: Orson AloeMergeri Diaz Barrios Type of delivery:  SVB Breastfeeding Experience:  P1 Maternal Medical Conditions:  N/A Maternal Medications:  Motrin  ________________________________________________________________________  Breastfeeding History (Post Discharge)  Frequency of breastfeeding:  Baby is not latching  Mom has [employer] (WIC Loaner) and Mom reports she pumps every 3 hours receiving 35-40 ml of EBM. Mom is giving EBM 35-40 ml back to baby via bottle every 2-3 hours.   Infant Intake and Output Assessment  Voids:  4-5 in 24 hrs.  Color:  Clear yellow Stools:  4 in 24 hrs.  Color:   Yellow  ________________________________________________________________________  Maternal Breast Assessment  Breast:  Full Nipple:  Erect Pain level:  1, no trauma observed on nipples  Feeding Assessment/Evaluation  Initial feeding assessment:  Infant's oral assessment:  Variance. Thick, short labial frenulum. Sucking callous along upper/lower lips.  Positioning:  Football Right breast  Mom's breast very full, nipple/aerola firm. Assisted Mom with hand expression to soften nipple/aerola. After this baby latched with using breast compression, LC demonstrated how to un-tuck the lips for more depth. Baby demonstrated some good suckling burst with swallows present. PS with initial latch was 1-2 resolving to 0 with baby nursing. Baby becomes sleepy during the feeding and lots of stimulation needed to keep awake at breast.   Pre-feed weight:  2602 g  (5 lb. 11.8 oz.) Post-feed weight:  2622 g (5 lb. 12.5 oz.) Amount transferred:  20 ml with nursing for 15 minutes  On left breast, hand expressed again to soften nipple/aerola to help with latch. Assisted Mom in cross cradle hold. Few attempts needed and help un-tucking lips for baby to obtain good depth. Good suckling bursts observed but baby quickly becomes sleepy at breast. Baby nursed for 5 minutes transferring an additional 10 ml.  After this LC assisted Mom with hand expression to soften breasts, some nodules noted in right breast outer quadrants. LC stressed importance to Mom of these areas softening with nursing or pumping to prevent clogged milk duct or mastitis. Received an additional 20 of breast milk with hand expression and MGM gave this to baby via bottle.  Total amount transferred:  30 ml Total supplement given:  20 ml EBM  Feeding plan discussed with Mom: BF with each feeding 8-12 times or more in 24 hours Pre-pump for 3-5 minutes to soften  aerola and remove lower fat milk so baby gets to higher fat milk with nursing. Give  this back to baby with supplement at end of feeding. BF 15-20 minutes, both breasts each feeding if possible. Pump for 15-20 minutes after each feeding to empty breast well and to have EBM to supplement. Supplement with 30 ml or more if needed to satisfy baby after each feeding. Let family member help with giving supplement.  Our goal is to improve baby's ability to transfer milk by offering breast with each feeding so Mom can stop pumping/supplementing each feeding and just BF her baby.  If baby does not go to breast and Mom pump/bottle then give 60 ml EBM with feeding. Offered OP f/u, Mom will call if needed. Encouraged support group.

## 2019-08-08 ENCOUNTER — Telehealth: Payer: Self-pay

## 2019-08-08 ENCOUNTER — Ambulatory Visit
Admission: EM | Admit: 2019-08-08 | Discharge: 2019-08-08 | Disposition: A | Discharge status: Home / Self Care | Payer: Self-pay | Attending: Physician Assistant | Admitting: Physician Assistant

## 2019-08-08 ENCOUNTER — Other Ambulatory Visit: Payer: Self-pay

## 2019-08-08 DIAGNOSIS — S21002A Unspecified open wound of left breast, initial encounter: Secondary | ICD-10-CM

## 2019-08-08 DIAGNOSIS — N6320 Unspecified lump in the left breast, unspecified quadrant: Secondary | ICD-10-CM

## 2019-08-08 MED ORDER — MUPIROCIN 2 % EX OINT: 1.0000 "application " | TOPICAL_OINTMENT | Freq: Two times a day (BID) | CUTANEOUS | 0 refills | Status: DC

## 2019-08-08 NOTE — Telephone Encounter (Signed)
Telephoned patient at home number (using interpreter#[phone])) left voice message with BCCCP contact information.

## 2019-08-08 NOTE — Discharge Instructions (Signed)
Keep open wound clean and dry. Clean gently with soap and water. Bactroban ointment with gauze to prevent infection and further friction. You will be contacted to schedule for mammogram/ultrasound. Please make appointment with GYN for follow up was well.

## 2019-08-08 NOTE — ED Provider Notes (Signed)
[facility]    CSN: [id number] Arrival date & time: 08/08/19  1046      History   Chief Complaint Chief Complaint  Patient presents with  . Breast Pain    HPI Sherri [name] is a 29 y.o. female.   29 year old female comes in for 1-2 month history of left breast mass with pain. HPI obtained by patient through video translator. States first noticed a lump/knot to the area that then self erupted with drainage. Has been drainage to the past 2-3 weeks now with erythema, warmth, pain. Denies fever, nipple discharge. No personal or family history of breast cancer. Not breast feeding. LMP 07/24/2019.  States finished antibiotics [place] 1 [place].      Past Medical History:  Diagnosis Date  . Medical history non-contributory     Patient Active Problem List   Diagnosis Date Noted  . Preterm premature rupture of membranes (PPROM) delivered, current hospitalization 07/10/2015    History reviewed. No pertinent surgical history.  OB History    Gravida  1   Para  1   Term      Preterm  1   AB      Living  1     SAB      TAB      Ectopic      Multiple  0   Live Births  1            Home Medications    Prior to Admission medications   Medication Sig Start Date End Date Taking? Authorizing Provider  mupirocin ointment (BACTROBAN) 2 % Apply 1 application topically 2 (two) times daily. 08/08/19   Belinda Fisher, PA-C    Family History No family history on file.  Social History Social History   Tobacco Use  . Smoking status: Former Games developer  . Smokeless tobacco: Never Used  Substance Use Topics  . Alcohol use: No  . Drug use: No     Allergies   Pork-derived products and Penicillins   Review of Systems Review of Systems  Reason unable to perform ROS: See HPI as above.     Physical Exam Triage Vital Signs ED Triage Vitals [08/08/19 1110]  Enc Vitals Group     BP 101/69     Pulse Rate 68     Resp  16     Temp (!) 97.4 F (36.3 C)     Temp Source Oral     SpO2 99 %     Weight      Height      Head Circumference      Peak Flow      Pain Score      Pain Loc      Pain Edu?      Excl. in GC?    No data found.  Updated Vital Signs BP 101/69 (BP Location: Left Arm)   Pulse 68   Temp (!) 97.4 F (36.3 C) (Oral)   Resp 16   LMP 07/25/2019   SpO2 99%   Breastfeeding No   Physical Exam Constitutional:      General: She is not in acute distress.    Appearance: Normal appearance. She is well-developed. She is not toxic-appearing or diaphoretic.  HENT:     Head: Normocephalic and atraumatic.  Eyes:     Conjunctiva/sclera: Conjunctivae normal.     Pupils: Pupils are equal, round, and reactive to light.  Pulmonary:  Effort: Pulmonary effort is normal. No respiratory distress.  Chest:     Comments: 5cm x 6cm irregular shaped hard mass to medial left breast. 3 ulcerations directly medial to the nipple at 9 o'clock region with surrounding hyperpigmentation and scarring. No active drainage, erythema, warmth, induration. No nipple discharge, inversion. No obvious axillary lymphadenopathy.  Musculoskeletal:     Cervical back: Normal range of motion and neck supple.  Skin:    General: Skin is warm and dry.  Neurological:     Mental Status: She is alert and oriented to person, place, and time.    UC Treatments / Results  Labs (all labs ordered are listed, but only abnormal results are displayed) Labs Reviewed - No data to display  EKG   Radiology No results found.  Procedures Procedures (including critical care time)  Medications Ordered in UC Medications - No data to display  Initial Impression / Assessment and Plan / UC Course  I have reviewed the triage vital signs and the nursing notes.  Pertinent labs & imaging results that were available during my care of the patient were reviewed by me and considered in my medical decision making (see chart for  details).    Discussed wound care instructions. Mammogram and ultrasound ordered. Resources provided for GYN follow up. Return precautions given.  Final Clinical Impressions(s) / UC Diagnoses   Final diagnoses:  Left breast mass  Breast wound, left, initial encounter    ED Prescriptions    Medication Sig Dispense Auth. Provider   mupirocin ointment (BACTROBAN) 2 % Apply 1 application topically 2 (two) times daily. 22 g Belinda Fisher, PA-C     PDMP not reviewed this encounter.   Belinda Fisher, PA-C 08/08/19 1143

## 2019-08-08 NOTE — ED Triage Notes (Signed)
Pt c/o lt breast pain with a lump x3 months

## 2019-08-18 ENCOUNTER — Other Ambulatory Visit: Payer: Self-pay

## 2019-08-18 ENCOUNTER — Other Ambulatory Visit: Payer: Self-pay | Admitting: Obstetrics and Gynecology

## 2019-08-18 ENCOUNTER — Ambulatory Visit
Admission: RE | Admit: 2019-08-18 | Discharge: 2019-08-18 | Disposition: A | Discharge status: Home / Self Care | Payer: Self-pay | Source: Ambulatory Visit | Attending: Obstetrics and Gynecology | Admitting: Obstetrics and Gynecology

## 2019-08-18 ENCOUNTER — Ambulatory Visit
Admission: RE | Admit: 2019-08-18 | Discharge: 2019-08-18 | Disposition: A | Discharge status: Home / Self Care | Payer: No Typology Code available for payment source | Source: Ambulatory Visit | Attending: Obstetrics and Gynecology | Admitting: Obstetrics and Gynecology

## 2019-08-18 ENCOUNTER — Ambulatory Visit: Payer: Self-pay | Admitting: *Deleted

## 2019-08-18 VITALS — BP 100/68 | Temp 98.0°F | Wt 133.6 lb

## 2019-08-18 DIAGNOSIS — N6320 Unspecified lump in the left breast, unspecified quadrant: Secondary | ICD-10-CM

## 2019-08-18 DIAGNOSIS — Z01419 Encounter for gynecological examination (general) (routine) without abnormal findings: Secondary | ICD-10-CM

## 2019-08-18 NOTE — Progress Notes (Addendum)
Ms. Dorathea Faerber is a 29 y.o. G10P0101 female who presents to [facility] clinic today with complaints of a left breast lump and pain.    Pap Smear: Pap smear completed today. Last Pap smear was in 03/05/2015 at [facility] and was normal. Per patient has no history of an abnormal Pap smear. Last Pap smear result is not available in Epic.   Physical exam: Breasts Breasts symmetrical. The skin on the left breast has an open area between 7 and 10 o'clock that is draining a small amount of clear yellow discharge. The patient states her pain is a 1/10 in severity. No nipple retraction bilateral breasts. No nipple discharge bilateral breasts. No lymphadenopathy. A lump was palpated in the left breast under the open area that is draining possible indicating that this may be an abscess that has ruptured.       Pelvic/Bimanual Ext Genitalia No lesions, no swelling and no discharge observed on external genitalia.        Vagina Vagina pink and normal texture. No lesions or discharge observed in vagina.        Cervix Cervix is present. Cervix pink and of normal texture. Whitish yellow discharge observed in the cervical os. IUD strings were also noted.    Uterus Uterus is present and palpable. Uterus in normal position and normal size.        Adnexae Bilateral ovaries present and palpable. No tenderness on palpation.         Rectovaginal No rectal exam completed today since patient had no rectal complaints. No skin abnormalities observed on exam.     Smoking History: Patient has never smoked.   Patient Navigation: Patient education provided. Access to services provided for patient through BCCCP program. [name], Spanish interpreter provided through [facility].    Breast and Cervical Cancer Risk Assessment: Patient does not have family history of breast cancer, known genetic mutations, or radiation treatment to the chest before age 19. Patient does not have  history of cervical dysplasia, immunocompromised, or DES exposure in-utero. Gail risk assessment completed but no information is available due to patient being under age 28.  Risk Assessment    Risk Scores      08/18/2019   Last edited by: Narda Rutherford, LPN   5-year risk:    Lifetime risk:            A: BCCCP exam with pap smear Complaints of a left breast lump with pain.   P: Referred patient to the [facility] for a diagnostic mammogram. Appointment scheduled August 18, 2019 at 9:20am.  Mila Homer, RN, FNP student 08/18/2019 9:16 AM   Attestation of Supervision of Student:  I confirm that I have verified the information documented in the nurse practitioner student's note and that I have also personally reperformed the history, physical exam and all medical decision making activities.  I have verified that all services and findings are accurately documented in this student's note; and I agree with management and plan as outlined in the documentation. I have also made any necessary editorial changes.  Brannock, Kathaleen Maser, RN [facility] [employer], [facility] 08/18/2019 1:16 PM

## 2019-08-18 NOTE — Patient Instructions (Addendum)
Informed Sherri [name] about breast self awareness. Patient received a Pap smear today. Let her know BCCCP will cover Pap smears and HPV typing every 5 years unless has a history of abnormal Pap smears. Referred patient to the [facility] for a diagnostic mammogram. Appointment scheduled August 18, 2019 at 9:20am. Patient aware of appointment and will be there. Let patient know will follow up with her within the next couple weeks with results of Pap smear by letter or phone. [name] verbalized understanding.  Mila Homer, RN, FNP student 9:23 AM

## 2019-08-22 LAB — CYTOLOGY - PAP
Comment: NEGATIVE
Diagnosis: NEGATIVE
High risk HPV: NEGATIVE

## 2019-08-23 LAB — AEROBIC/ANAEROBIC CULTURE (SURGICAL/DEEP WOUND): Special Requests: NORMAL

## 2019-08-23 LAB — AEROBIC/ANAEROBIC CULTURE W GRAM STAIN (SURGICAL/DEEP WOUND): Culture: NO GROWTH

## 2019-08-25 ENCOUNTER — Telehealth: Payer: Self-pay

## 2019-08-25 NOTE — Telephone Encounter (Signed)
Via, Sherri [name], Patient informed negative Pap/HPV results, next pap smear in 5 years. Patient was pleased with results and verbalized understanding.

## 2020-03-20 ENCOUNTER — Other Ambulatory Visit: Payer: Self-pay

## 2020-03-20 DIAGNOSIS — N6320 Unspecified lump in the left breast, unspecified quadrant: Secondary | ICD-10-CM

## 2020-03-26 ENCOUNTER — Other Ambulatory Visit: Payer: Self-pay | Admitting: Obstetrics and Gynecology

## 2020-03-27 ENCOUNTER — Ambulatory Visit: Payer: Self-pay | Admitting: *Deleted

## 2020-03-27 ENCOUNTER — Other Ambulatory Visit: Payer: Self-pay

## 2020-03-27 ENCOUNTER — Ambulatory Visit
Admission: RE | Admit: 2020-03-27 | Discharge: 2020-03-27 | Disposition: A | Discharge status: Home / Self Care | Payer: No Typology Code available for payment source | Source: Ambulatory Visit | Attending: Obstetrics and Gynecology | Admitting: Obstetrics and Gynecology

## 2020-03-27 VITALS — BP 112/82 | Wt 142.1 lb

## 2020-03-27 DIAGNOSIS — N6320 Unspecified lump in the left breast, unspecified quadrant: Secondary | ICD-10-CM

## 2020-03-27 DIAGNOSIS — N644 Mastodynia: Secondary | ICD-10-CM

## 2020-03-27 DIAGNOSIS — Z1239 Encounter for other screening for malignant neoplasm of breast: Secondary | ICD-10-CM

## 2020-03-27 NOTE — Progress Notes (Signed)
[name] is a 30 y.o. female who presents to [facility] clinic today with complaint of left breast redness and pain. Patient states the redness has not resolved since August 2021. Patient states the wound has closed and the lump resolved within the left breast since previous BCCCP visit 08/18/2019. Patient stated the pain decreased after her examination in August but has increased since December 2021. Patient states the pain comes and goes. Patient rates the pain at a 5 out of 10.    Pap Smear: Pap smear not completed today. Last Pap smear was 08/18/2019 at [facility] clinic and was normal with negative HPV. Per patient has no history of an abnormal Pap smear. Last Pap smear result is available in Epic.   Physical exam: Breasts Breasts symmetrical. No skin abnormalities left breast. Redness observed right breast between 8 o'clock and 10 o'clock next to areola. No nipple retraction bilateral breasts. No nipple discharge bilateral breasts. No lymphadenopathy. No lumps palpated bilateral breasts. Patient complained of left inner breast pain on exam.      Pelvic/Bimanual Pap is not indicated today per BCCCP guidelines.   Smoking History: Patient has never smoked.   Patient Navigation: Patient education provided. Access to services provided for patient through [facility] program. Spanish interpreter Sherri [name] from [facility] provided.    Breast and Cervical Cancer Risk Assessment: Patient does not have family history of breast cancer, known genetic mutations, or radiation treatment to the chest before age 58. Patient does not have history of cervical dysplasia, immunocompromised, or DES exposure in-utero. Breast cancer risk completed. No breast cancer risk calculated due to patient is less than 32 years old.  Risk Assessment    Risk Scores      03/27/2020 08/18/2019   Last edited by: Sherri [name], CMA Sherri [name], Sherri Demetrius Charity, LPN   5-year risk:     Lifetime risk:            A: BCCCP exam  without pap smear Complaint of redness and pain within the left breast.   P: Referred patient to the [facility] for a left breast ultrasound. Appointment scheduled Thursday, March 27, 2020 at 0920.  Sherri Heidelberg, RN 03/27/2020 8:35 AM

## 2020-03-27 NOTE — Patient Instructions (Signed)
Explained breast self awareness with [name]. Patient did not need a Pap smear today due to last Pap smear and HPV typing was 08/18/2019. Let her know BCCCP will cover Pap smears and HPV typing every 5 years unless has a history of abnormal Pap smears. Referred patient to the [facility] for a left breast ultrasound. Appointment scheduled Thursday, March 27, 2020 at 0920. Patient aware of appointment and will be there. Sherri [name] verbalized understanding.  Jabril Pursell, Kathaleen Maser, RN 8:35 AM

## 2020-04-06 ENCOUNTER — Telehealth: Payer: Self-pay

## 2020-04-06 NOTE — Telephone Encounter (Signed)
Opened in error

## 2020-05-02 ENCOUNTER — Ambulatory Visit (INDEPENDENT_AMBULATORY_CARE_PROVIDER_SITE_OTHER): Payer: Self-pay | Admitting: Family Medicine

## 2020-05-02 ENCOUNTER — Other Ambulatory Visit: Payer: Self-pay

## 2020-05-02 ENCOUNTER — Encounter: Payer: Self-pay | Admitting: Family Medicine

## 2020-05-02 ENCOUNTER — Encounter: Payer: Self-pay | Admitting: General Practice

## 2020-05-02 VITALS — BP 124/84 | HR 91 | Wt 141.6 lb

## 2020-05-02 DIAGNOSIS — Z30432 Encounter for removal of intrauterine contraceptive device: Secondary | ICD-10-CM

## 2020-05-02 DIAGNOSIS — Z3202 Encounter for pregnancy test, result negative: Secondary | ICD-10-CM

## 2020-05-02 DIAGNOSIS — Z3009 Encounter for other general counseling and advice on contraception: Secondary | ICD-10-CM

## 2020-05-02 LAB — POCT PREGNANCY, URINE: Preg Test, Ur: NEGATIVE

## 2020-05-02 MED ORDER — IBUPROFEN 800 MG PO TABS
800.0000 mg | ORAL_TABLET | Freq: Once | ORAL | Status: AC
  Administered 2020-05-02: 800 mg via ORAL

## 2020-05-02 MED ORDER — NORGESTIMATE-ETH ESTRADIOL 0.25-35 MG-MCG PO TABS: 1.0000 | ORAL_TABLET | Freq: Every day | ORAL | 11 refills | Status: DC

## 2020-05-02 NOTE — Patient Instructions (Signed)
Informacin sobre los anticonceptivos hormonales Hormonal Contraception Information Los anticonceptivos hormonales son un tipo de mtodo anticonceptivo en el que se usan hormonas para Neurosurgeon. Habitualmente, incluyen una combinacin de las hormonas estrgeno y Education officer, museum o solo la hormona progesterona. Los anticonceptivos hormonales actan de las siguientes maneras:  Espesan la mucosidad del cuello uterino, que es la parte inferior del tero. Esta mucosidad ms espesa dificulta la entrada de los espermatozoides al tero.  Modifica el revestimiento interno del tero. Esto hace que al vulo le sea ms difcil fijarse o implantarse.  Pueden impedir que los ovarios liberen vulos (ovulacin). Algunas mujeres que toman anticonceptivos hormonales que contienen solo progesterona pueden seguir ovulando. Los anticonceptivos hormonales no pueden prevenir las ITS (infecciones de transmisin sexual). Sigue habiendo riesgo de McDonald. Tipos de anticonceptivos hormonales Anticonceptivos de Astronomer y [employer] anticonceptivos que incluyen la combinacin de estrgeno y progesterona estn disponibles en estas presentaciones:  Pldoras. Las pldoras vienen con diferentes combinaciones de hormonas. Las pldoras deben tomarse a la misma hora Management consultant. Pueden afectar el perodo. Podra tener el perodo una vez al mes, una vez cada 3 meses o no tenerlo.  Parche. El parche se aplica en las nalgas, el abdomen, la parte superior externa del brazo o la espalda. Se mantiene en su lugar durante 3 semanas. Se retira para la ltima o cuarta semana del ciclo.  Anillo vaginal. El anillo se coloca en la vagina y se deja all durante 3semanas. Luego se retira para la ltima o cuarta semana del ciclo.   Anticonceptivos de Office manager Los anticonceptivos que incluyen solo progesterona estn disponibles en estas presentaciones:  Pldoras. Las pldoras deben tomarse a la misma hora [employer]. Esto es muy importante para reducir la probabilidad de que se produzca un Psychiatrist. Las pldoras que contienen solo progestina se suelen tomar [employer] del Gay. Otros tipos de pldoras pueden tener un comprimido de placebo para los ltimos [address]  [address] de cada ciclo.  Dispositivo intrauterino (DIU). Este dispositivo se inserta en el tero a travs de la vagina y el cuello uterino. Se retira o reemplaza cada 3 a 5 aos, dependiendo del tipo. Se puede retirar antes.  Implante. Se colocan unas varillas plsticas debajo de la piel en la zona superior del brazo. Se retiran o se reemplazan cada 3 aos. Se pueden retirar antes.  Inyeccin. La inyeccin se administra una vez cada 12 o 13 semanas (alrededor de 3 meses).   Riesgos asociados con los anticonceptivos hormonales Los anticonceptivos de estrgeno y progesterona a Occupational psychologist pueden causar efectos secundarios como, por ejemplo:  Nuseas.  Dolores de [place].  Dolor a Multimedia programmer.  Sangrado o manchado entre los [employer] Corporation.  Presin arterial alta (poco frecuente).  Accidente cerebrovascular, infarto de miocardio o cogulos de sangre (poco frecuente). Los anticonceptivos que contienen solo progesterona tambin pueden tener efectos secundarios, como los siguientes:  Nuseas.  Dolores de [place].  Dolor a Multimedia programmer.  Sangrados menstruales irregulares.  Presin arterial alta (poco frecuente). Hable con su mdico sobre lo que pueden significar para usted los efectos secundarios. Preguntas posibles:  Qu tipo de anticonceptivos hormonales es adecuado para m?  Por cunto tiempo debo planificar el uso de anticonceptivos hormonales?  Cules son los efectos secundarios del mtodo anticonceptivo hormonal que eleg?  Cmo puedo prevenir las ITS mientras uso anticonceptivos hormonales? Dnde buscar ms informacin Solicite al mdico ms informacin y recursos United Stationers anticonceptivos hormonales.  Tambin puede  ir a:  U.S. Department of Health and [employer], Office on [address] Health (Departamento de Salud y [place] de los [place] de Salud de la Mujer): [email or web address] Resumen  El estrgeno y la progesterona son hormonas usadas en muchas formas de mtodos anticonceptivos.  Los anticonceptivos hormonales no pueden prevenir las ITS (infecciones de transmisin sexual).  Hable con su mdico sobre lo que pueden significar para usted los efectos secundarios.  Solicite al mdico ms informacin y [employer] anticonceptivos hormonales. Esta informacin no tiene Theme park manager el consejo del mdico. Asegrese de hacerle al mdico cualquier pregunta que tenga. Document Revised: 10/27/2019 Document Reviewed: 09/13/2019 Elsevier Patient Education  2021 [employer].

## 2020-05-02 NOTE — Addendum Note (Signed)
Addended by: Kathee Delton on: 05/02/2020 09:40 AM   Modules accepted: Orders

## 2020-05-02 NOTE — Progress Notes (Signed)
    GYNECOLOGY OFFICE PROCEDURE NOTE  Sherri [name] is a 30 y.o. G1P0101 here for Paragard IUD removal. No GYN concerns.  Last pap smear was on 08/18/2019 and was normal. Patient reports she was referred from [facility] after failed removal x2. Also reports she had an [place] confirming the presence of the IUD.  IUD Removal  Patient identified, informed consent performed, consent signed.  Patient was in the dorsal lithotomy position, normal external genitalia was noted.  A speculum was placed in the patient's vagina, normal discharge was noted, no lesions. The cervix was visualized, no lesions, no abnormal discharge.  No strings were seen. A tenaculum was placed anteriorly on the cervix and the uterus was sounded to 7 cm. An IUD hook was then inserted and multiple passes were attempted without successful removal of the IUD. At this point I asked Dr. Jolayne Panther to come and attempt removal, after several passes she was able to successfully remove the Paragard IUD in it's entirety. Patient tolerated the procedure remarkably well.    Patient will use OCP's for contraception.  Routine preventative health maintenance measures emphasized.  Venora Maples, MD/MPH Attending Family Medicine Physician, [facility] for [facility], [facility]

## 2020-07-12 ENCOUNTER — Ambulatory Visit (INDEPENDENT_AMBULATORY_CARE_PROVIDER_SITE_OTHER): Payer: Self-pay | Admitting: *Deleted

## 2020-07-12 ENCOUNTER — Other Ambulatory Visit: Payer: Self-pay

## 2020-07-12 ENCOUNTER — Ambulatory Visit: Payer: Self-pay

## 2020-07-12 VITALS — BP 106/76 | Ht 60.0 in | Wt 139.1 lb

## 2020-07-12 DIAGNOSIS — Z3201 Encounter for pregnancy test, result positive: Secondary | ICD-10-CM

## 2020-07-12 DIAGNOSIS — Z3200 Encounter for pregnancy test, result unknown: Secondary | ICD-10-CM

## 2020-07-12 LAB — POCT PREGNANCY, URINE: Preg Test, Ur: POSITIVE — AB

## 2020-07-12 NOTE — Patient Instructions (Signed)
Prenatal Care Providers           [facility]  [address] [phone]  [facility]   [address]  [phone]  [facility] [place]       [address] [phone]            [facility] [place]     [address] [phone]          [facility] [place]   [address] [phone]  [facility]  [address] [phone]     [facility])  [address]   [phone]     [facility]  Phone: [phone]  [facility]  Phone: [phone]  [facility] Phone: [phone]  [facility] Phone: [phone]  [facility] Phone: [phone]  [facility] Phone: [phone]  [facility]  Phone: [phone]  

## 2020-07-12 NOTE — Progress Notes (Signed)
Here for pregnancy test which was positive. Reports sure LMP of 05/13/20 this makes her [redacted]w[redacted]d with EDD 02/18/20. Advised to start prenatal are and PNV. She plans to go to [facility] where she went before. Proof of pregnancy letter prepared and prenatal provider list placed in AVS. Vaden Becherer,RN

## 2020-07-12 NOTE — Progress Notes (Signed)
Patient seen and assessed by nursing staff.  Agree with documentation and plan.  

## 2020-07-18 ENCOUNTER — Other Ambulatory Visit: Payer: Self-pay

## 2020-07-18 ENCOUNTER — Ambulatory Visit (INDEPENDENT_AMBULATORY_CARE_PROVIDER_SITE_OTHER): Payer: Self-pay | Admitting: *Deleted

## 2020-07-18 VITALS — BP 101/66 | HR 86 | Wt 138.6 lb

## 2020-07-18 DIAGNOSIS — Z789 Other specified health status: Secondary | ICD-10-CM | POA: Insufficient documentation

## 2020-07-18 DIAGNOSIS — O09899 Supervision of other high risk pregnancies, unspecified trimester: Secondary | ICD-10-CM

## 2020-07-18 DIAGNOSIS — O0990 Supervision of high risk pregnancy, unspecified, unspecified trimester: Secondary | ICD-10-CM | POA: Insufficient documentation

## 2020-07-18 NOTE — Progress Notes (Signed)
New OB Intake   Patient in office today.  I explained I am completing New OB Intake today. We discussed her EDD of 02/17/21 that is based on LMP of 05/10/20. She states she was taking birth control pills but forgot pill one day and got pregnant. States stopped taking pills when she realized she is pregnant.  Pt is G2/P0101. I reviewed her allergies, medications, Medical/Surgical/OB history, and appropriate screenings. I informed her of [facility] services.I offered referral . She declines at present. Based on history, this is a/an  pregnancy complicated by PTD  .   Patient Active Problem List   Diagnosis Date Noted   Supervision of high risk pregnancy, antepartum 07/18/2020   History of preterm delivery, currently pregnant 07/18/2020   Language barrier 07/18/2020    Concerns addressed today  Delivery Plans:  Plans to deliver at [facility].   MyChart/Babyscripts MyChart text sent but did not receive while in office. I explained pt will have some visits in office and some virtually. Babyscripts instructions given and order placed. Patient verifies receipt of registration text/e-mail. Account successfully created and app downloaded.  Blood Pressure Cuff  Patient is self pay. Given bp cuff during visit with instructions.  Explained after first prenatal appt pt will check weekly and document in Babyscripts.  Also given new ob packet.   Weight scale: Patient   does not  have weight scale. Weight scale  not ordered for patient due to no insurance.    Anatomy US Explained first scheduled Korea will be around 19 weeks. Anatomy US scheduled for [id number] at 0945. Pt notified to arrive at 0930.  Labs Too early for genetic screening today.  Routine prenatal labs needed.  Covid Vaccine Patient has had covid vaccine.   Mother/ Baby Dyad Candidate?   No If yes, offer as possibility  Informed patient of [facility] website  and placed link in her AVS.   Social Determinants of Health Food  Insecurity: Patient expresses food insecurity. Food Market information given to patient; explained patient may visit at the end of first OB appointment. Patient declines for today.  WIC Referral: Patient is interested in referral to [facility].  Transportation: Patient denies transportation needs. Childcare: Discussed no children allowed at ultrasound appointments. Offered childcare services; patient declines childcare services at this time.   Placed OB Box on problem list and updated  First visit review I reviewed new OB appt with pt. I explained she will have a pelvic exam, ob bloodwork with genetic screening, and PAP smear. Explained pt will be seen by Dr. Crissie Reese at first visit; encounter routed to appropriate provider. Explained that patient will be seen by pregnancy navigator following visit with provider. [facility] information placed in AVS.   Abbrielle Batts,RN 07/18/2020  11:46 AM

## 2020-07-18 NOTE — Patient Instructions (Signed)
  At our [facility] Practices, we work as an integrated team, providing care to address both physical and emotional health. Your medical provider may refer you to see our Behavioral Health Clinician [facility]) on the same day you see your medical provider, as availability permits; often scheduled virtually at your convenience.  Our [facility] is available to all patients, visits generally last between 20-30 minutes, but can be longer or shorter, depending on patient need. The [facility] offers help with stress management, coping with symptoms of depression and anxiety, major life changes , sleep issues, changing risky behavior, grief and loss, life stress, working on personal life goals, and  behavioral health issues, as these all affect your overall health and wellness.  The [facility] is NOT available for the following: FMLA paperwork, court-ordered evaluations, specialty assessments (custody or disability), letters to employers, or obtaining certification for an emotional support animal. The [facility] does not provide long-term therapy. You have the right to refuse integrated behavioral health services, or to reschedule to see the [facility] at a later date.  Confidentiality exception: If it is suspected that a child or disabled adult is being abused or neglected, we are required by law to report that to either Child Protective Services or Adult Management consultant.  If you have a diagnosis of Bipolar affective disorder, Schizophrenia, or recurrent Major depressive disorder, we will recommend that you establish care with a psychiatrist, as these are lifelong, chronic conditions, and we want your overall emotional health and medications to be more closely monitored. If you anticipate needing extended maternity leave due to mental health issues postpartum, it it recommended you inform your medical provider, so we can put in a referral to a psychiatrist as soon as possible. The [facility] is unable to recommend an extended maternity leave for mental  health issues. Your medical provider or [facility] may refer you to a therapist for ongoing, traditional therapy, or to a psychiatrist, for medication management, if it would benefit your overall health. Depending on your insurance, you may have a copay or be charged a deductible, depending on your insurance, to see the [facility]. If you are uninsured, it is recommended that you apply for financial assistance. (Forms may be requested at the front desk for in-person visits, via MyChart, or request a form during a virtual visit).  If you see the [facility] more than 6 times, you will have to complete a comprehensive clinical assessment interview with the [facility] to resume integrated services.  For virtual visits with the [facility], you must be physically in the state of [place] at the time of the visit. For example, if you live in [place], you will have to do an in-person visit with the [facility], and your out-of-state insurance may not cover behavioral health services in [place]. If you are going out of the state or country for any reason, the [facility] may see you virtually when you return to [place], but not while you are physically outside of [place].     [email or web address] is a good resource

## 2020-07-19 LAB — CBC/D/PLT+RPR+RH+ABO+RUBIGG...
Antibody Screen: NEGATIVE
Basophils Absolute: 0 10*3/uL (ref 0.0–0.2)
Basos: 0 %
EOS (ABSOLUTE): 0.1 10*3/uL (ref 0.0–0.4)
Eos: 1 %
HCV Ab: 0.3 s/co ratio (ref 0.0–0.9)
HIV Screen 4th Generation wRfx: NONREACTIVE
Hematocrit: 35.2 % (ref 34.0–46.6)
Hemoglobin: 11.1 g/dL (ref 11.1–15.9)
Hepatitis B Surface Ag: NEGATIVE
Immature Grans (Abs): 0 10*3/uL (ref 0.0–0.1)
Immature Granulocytes: 0 %
Lymphocytes Absolute: 2.2 10*3/uL (ref 0.7–3.1)
Lymphs: 32 %
MCH: 24.1 pg — ABNORMAL LOW (ref 26.6–33.0)
MCHC: 31.5 g/dL (ref 31.5–35.7)
MCV: 76 fL — ABNORMAL LOW (ref 79–97)
Monocytes Absolute: 0.6 10*3/uL (ref 0.1–0.9)
Monocytes: 8 %
Neutrophils Absolute: 4.1 10*3/uL (ref 1.4–7.0)
Neutrophils: 59 %
Platelets: 306 10*3/uL (ref 150–450)
RBC: 4.61 x10E6/uL (ref 3.77–5.28)
RDW: 18.4 % — ABNORMAL HIGH (ref 11.7–15.4)
RPR Ser Ql: NONREACTIVE
Rh Factor: POSITIVE
Rubella Antibodies, IGG: 2.1 index (ref >0.99)
WBC: 7 10*3/uL (ref 3.4–10.8)

## 2020-07-19 LAB — HCV INTERPRETATION

## 2020-07-19 LAB — HEMOGLOBIN A1C
Est. average glucose Bld gHb Est-mCnc: 108 mg/dL
Hgb A1c MFr Bld: 5.4 % (ref 4.8–5.6)

## 2020-07-20 LAB — URINE CULTURE, OB REFLEX

## 2020-07-20 LAB — CULTURE, OB URINE

## 2020-08-01 ENCOUNTER — Other Ambulatory Visit: Payer: Self-pay

## 2020-08-01 ENCOUNTER — Ambulatory Visit (INDEPENDENT_AMBULATORY_CARE_PROVIDER_SITE_OTHER): Payer: Self-pay | Admitting: Obstetrics and Gynecology

## 2020-08-01 ENCOUNTER — Encounter: Payer: Self-pay | Admitting: Obstetrics and Gynecology

## 2020-08-01 VITALS — BP 103/69 | HR 74 | Wt 139.0 lb

## 2020-08-01 DIAGNOSIS — Z3A11 11 weeks gestation of pregnancy: Secondary | ICD-10-CM

## 2020-08-01 DIAGNOSIS — O0990 Supervision of high risk pregnancy, unspecified, unspecified trimester: Secondary | ICD-10-CM

## 2020-08-01 DIAGNOSIS — O09899 Supervision of other high risk pregnancies, unspecified trimester: Secondary | ICD-10-CM

## 2020-08-01 DIAGNOSIS — Z789 Other specified health status: Secondary | ICD-10-CM

## 2020-08-01 NOTE — Progress Notes (Signed)
Patient stated that she has occasional abdominal cramps

## 2020-08-01 NOTE — Progress Notes (Signed)
INITIAL PRENATAL VISIT NOTE  Subjective:  Sherri [name] is a 30 y.o. G2P0101 at [redacted]w[redacted]d by LMP being seen today for her initial prenatal visit. This is a planned pregnancy. She has an obstetric history significant for PPROM at [redacted]w[redacted]d with subsequent delivery. She has a medical history significant for n/a.  Patient reports no complaints.  Contractions: Not present.  .  Movement: Absent. Denies leaking of fluid.   Past Medical History:  Diagnosis Date   Medical history non-contributory    Preterm labor     History reviewed. No pertinent surgical history.  OB History  Gravida Para Term Preterm AB Living  2 1   1   1   SAB IAB Ectopic Multiple Live Births        0 1    # Outcome Date GA Lbr Len/2nd Weight Sex Delivery Anes PTL Lv  2 Current           1 Preterm 07/11/15 [redacted]w[redacted]d 08:37 / 00:17 5 lb 14.6 oz (2.682 kg) M Vag-Spont EPI  LIV     Birth Comments: left ring and pinky finger fused together , PROM    Social History   Socioeconomic History   Marital status: Single    Spouse name: Not on file   Number of children: 1   Years of education: Not on file   Highest education level: High school graduate  Occupational History   Not on file  Tobacco Use   Smoking status: Never   Smokeless tobacco: Never  Vaping Use   Vaping Use: Never used  Substance and Sexual Activity   Alcohol use: No   Drug use: No   Sexual activity: Yes    Birth control/protection: Pill  Other Topics Concern   Not on file  Social History Narrative   Not on file   Social Determinants of Health   Financial Resource Strain: Not on file  Food Insecurity: Food Insecurity Present   Worried About Running Out of Food in the Last Year: Sometimes true   Ran Out of Food in the Last Year: Sometimes true  Transportation Needs: No Transportation Needs   Lack of Transportation (Medical): No   Lack of Transportation (Non-Medical): No  Physical Activity: Not on file  Stress: Not on file  Social  Connections: Not on file    Family History  Problem Relation Age of Onset   Diabetes Father    Diabetes Paternal Grandmother      Current Outpatient Medications:    Prenatal Multivit-Min-Fe-FA (PRE-NATAL FORMULA) TABS, Take by mouth., Disp: , Rfl:    mupirocin ointment (BACTROBAN) 2 %, Apply 1 application topically 2 (two) times daily. (Patient not taking: Reported on 08/01/2020), Disp: 22 g, Rfl: 0   norgestimate-ethinyl estradiol (ORTHO-CYCLEN) 0.25-35 MG-MCG tablet, Take 1 tablet by mouth daily. (Patient not taking: Reported on 08/01/2020), Disp: 28 tablet, Rfl: 11  Allergies  Allergen Reactions   Pork-Derived Products Hives   Penicillins Rash    Has patient had a PCN reaction causing immediate rash, facial/tongue/throat swelling, SOB or lightheadedness with hypotension: No Has patient had a PCN reaction causing severe rash involving mucus membranes or skin necrosis: No Has patient had a PCN reaction that required hospitalization No Has patient had a PCN reaction occurring within the last 10 years: No If all of the above answers are "NO", then may proceed with Cephalosporin use.     Review of Systems: Negative except for what is mentioned in HPI.  Objective:   Vitals:  08/01/20 1600  BP: 103/69  Pulse: 74  Weight: 139 lb (63 kg)    Fetal Status: Fetal Heart Rate (bpm): 145   Movement: Absent     Physical Exam: BP 103/69   Pulse 74   Wt 139 lb (63 kg)   LMP 05/13/2020   BMI 27.15 kg/m  CONSTITUTIONAL: Well-developed, well-nourished female in no acute distress.  NEUROLOGIC: Alert and oriented to person, place, and time. Normal reflexes, muscle tone coordination. No cranial nerve deficit noted. PSYCHIATRIC: Normal mood and affect. Normal behavior. Normal judgment and thought content. SKIN: Skin is warm and dry. No rash noted. Not diaphoretic. No erythema. No pallor. HENT:  Normocephalic, atraumatic, External right and left ear normal. Oropharynx is clear and  moist EYES: Conjunctivae and EOM are normal. Pupils are equal, round, and reactive to light. No scleral icterus.  NECK: Normal range of motion, supple, no masses CARDIOVASCULAR: Normal heart rate noted, regular rhythm RESPIRATORY: Effort and breath sounds normal, no problems with respiration noted BREASTS: deferred ABDOMEN: Soft, nontender, nondistended, gravid. GU: deferred MUSCULOSKELETAL: Normal range of motion. EXT:  No edema and no tenderness. 2+ distal pulses.   Assessment and Plan:  Pregnancy: G2P0101 at [redacted]w[redacted]d by LMP  1. Supervision of high risk pregnancy, antepartum - Reviewed [facility] [employer] structure, multiple providers, fellows, medical students, virtual visits, MyChart.  - normal labs - declines genetic screening today, will think about it - GC/CT on urine today  2. History of preterm delivery, currently pregnant - PPROM at [redacted]w[redacted]d with SVD at [redacted]w[redacted]d - offered Makena based on h/o PPROM at 42 weeks - she will consider, gave info  3. Language barrier Engineer, structural used  4. [redacted] weeks gestation of pregnancy    Preterm labor symptoms and general obstetric precautions including but not limited to vaginal bleeding, contractions, leaking of fluid and fetal movement were reviewed in detail with the patient.  Please refer to After Visit Summary for other counseling recommendations.   Return in about 4 weeks (around 08/29/2020) for high OB, in person.  Conan Bowens 08/01/2020 5:02 PM

## 2020-08-01 NOTE — Patient Instructions (Signed)
Hydroxyprogesterone caproate injection for pregnancy Qu es este medicamento? La HIDROXIPROGESTERONA es una hormona femenina. Este medicamento se [place] en mujeres embarazadas que han tenido un parto prematuro en el pasado. Ayuda adisminuir el riesgo de tener nuevamente un beb prematuro. Este medicamento puede ser utilizado para otros usos; si tiene Designer, industrial/product preguntaconsulte con su proveedor de atencin mdica o con su farmacutico. MARCAS COMUNES: Makena Qu le debo informar a mi profesional de la salud antes de tomar estemedicamento? Necesitan saber si usted presenta alguno de los siguientes problemas osituaciones: cncer de mama, cervical, uterino, o vaginal depresin diabetes o prediabetes enfermedad cardiaca presin sangunea alta antecedentes de cogulos sanguneos enfermedad renal enfermedad heptica enfermedad pulmonar o respiratoria, como asma migraas convulsiones sangrado vaginal una reaccin alrgica o inusual a la hidroxiprogesterona, a otras hormonas, al aceite de ricino, al alcohol benclico, a otros medicamentos, alimentos, colorantes o conservantes si estamamantando a un beb Cmo debo Visual merchandiser medicamento? Este medicamento se administra mediante inyeccin en un msculo o debajo de la piel. Recibir una inyeccin una vez por semana (cada 7 das) del modo indicado durante su Psychiatrist. Lo administra un profesional de Radiographer, therapeutic en un hospital oen un entorno clnico. Hable con su pediatra para informarse acerca del uso de [place] medicamento en nios. Aunque este medicamento se puede recetar a mujeres embarazadas tanjvenes como de 16 aos de edad, existen precauciones que deben tomarse. Sobredosis: Pngase en contacto inmediatamente con un centro toxicolgico o unasala de urgencia si usted cree que haya tomado demasiado medicamento. ATENCIN: [employer] es solo para usted. No comparta este medicamento connadie. Qu sucede si me olvido de una dosis? Es importante no olvidar ninguna  dosis. Informe a su mdico o a su profesionalde la salud si no puede asistir a una cita. Qu puede interactuar con este medicamento? No se anticipan interacciones significativas. Puede ser que esta lista no menciona todas las posibles interacciones. Informe a su profesional de [employer] de [employer] productos a base de hierbas, medicamentos de [place] o suplementos nutritivos que est tomando. Si usted fuma, consume bebidas alcohlicas o si utiliza drogas ilegales, indqueselo tambin a su profesional de [employer]. Algunas sustancias pueden interactuar consu medicamento. A qu debo estar atento al usar [employer]? Se supervisar su embarazo atentamente mientras reciba este medicamento. Qu efectos secundarios puedo tener al [employer] este medicamento? Efectos secundarios que debe informar a su mdico o a su profesional de lasalud tan pronto como sea posible: Therapist, art, como erupcin cutnea, comezn/picazn o urticaria, e hinchazn de la cara, los labios o la lengua problemas respiratorios estado de nimo deprimido aumento de la presin sangunea mayor apetito o sed aumento de la frecuencia urinaria signos y sntomas de un cogulo sanguneo, tales como problemas respiratorios; cambios en la visin; dolor en el pecho; dolor de cabeza grave, repentino; dolor, hinchazn, calor en la pierna; dificultad para hablar; entumecimiento o debilidad repentina de la cara, el brazo o la pierna cansancio o debilidad inusual sangrado vaginal inusual color amarillento de losojos o la piel Efectos secundarios que generalmente no requieren atencin mdica (infrmelos asu mdico o a su profesional de la salud si persisten o si son molestos): diarrea retencin de lquidos e hinchazn nuseas dolor, enrojecimiento oirritacin en el lugar de la inyeccin Puede ser que esta lista no menciona todos los posibles efectos secundarios. Comunquese a su mdico por asesoramiento mdico [employer]. Usted puede informar los efectos secundarios a la FDA por telfono al1-800-FDA-1088. Dnde debo guardar mi medicina?  Este medicamento se administra en hospitales o clnicas, y no necesitarguardarlo en su casa. ATENCIN: Este folleto es un resumen. Puede ser que no cubra toda la posible informacin. Si usted tiene preguntas acerca de esta medicina, consulte con sumdico, su farmacutico o su profesional de Radiographer, therapeutic.  2022 Elsevier/Gold Standard (2016-10-16 00:00:00)

## 2020-08-30 ENCOUNTER — Encounter: Payer: Self-pay | Admitting: Obstetrics and Gynecology

## 2020-08-30 ENCOUNTER — Other Ambulatory Visit: Payer: Self-pay

## 2020-08-30 ENCOUNTER — Ambulatory Visit (INDEPENDENT_AMBULATORY_CARE_PROVIDER_SITE_OTHER): Payer: Self-pay | Admitting: Obstetrics and Gynecology

## 2020-08-30 VITALS — BP 97/66 | HR 81 | Wt 139.6 lb

## 2020-08-30 DIAGNOSIS — O0990 Supervision of high risk pregnancy, unspecified, unspecified trimester: Secondary | ICD-10-CM

## 2020-08-30 DIAGNOSIS — Z789 Other specified health status: Secondary | ICD-10-CM

## 2020-08-30 DIAGNOSIS — O09899 Supervision of other high risk pregnancies, unspecified trimester: Secondary | ICD-10-CM

## 2020-08-30 NOTE — Progress Notes (Signed)
   PRENATAL VISIT NOTE  Subjective:  Sherri [name] is a 30 y.o. G2P0101 at [redacted]w[redacted]d being seen today for ongoing prenatal care.  She is currently monitored for the following issues for this high-risk pregnancy and has Supervision of high risk pregnancy, antepartum; History of preterm delivery, currently pregnant; and Language barrier on their problem list.  Patient reports no complaints.  Contractions: Not present. Vag. Bleeding: None.  Movement: Absent. Denies leaking of fluid.   The following portions of the patient's history were reviewed and updated as appropriate: allergies, current medications, past family history, past medical history, past social history, past surgical history and problem list.   Objective:   Vitals:   08/30/20 0823  BP: 97/66  Pulse: 81  Weight: 139 lb 9.6 oz (63.3 kg)    Fetal Status: Fetal Heart Rate (bpm): u/s   Movement: Absent     General:  Alert, oriented and cooperative. Patient is in no acute distress.  Skin: Skin is warm and dry. No rash noted.   Cardiovascular: Normal heart rate noted  Respiratory: Normal respiratory effort, no problems with respiration noted  Abdomen: Soft, gravid, appropriate for gestational age.  Pain/Pressure: Present     Pelvic: Cervical exam deferred        Extremities: Normal range of motion.  Edema: None  Mental Status: Normal mood and affect. Normal behavior. Normal judgment and thought content.   Assessment and Plan:  Pregnancy: G2P0101 at [redacted]w[redacted]d 1. Supervision of high risk pregnancy, antepartum Patient is doing well without complaints AFP and panorama today  2. History of preterm delivery, currently pregnant Patient declined 17-P   3. Language barrier Spanish interpreter  Preterm labor symptoms and general obstetric precautions including but not limited to vaginal bleeding, contractions, leaking of fluid and fetal movement were reviewed in detail with the patient. Please refer to After Visit Summary for  other counseling recommendations.   Return in about 4 weeks (around 09/27/2020) for in person, ROB, High risk.  Future Appointments  Date Time Provider Department Center  09/25/2020  9:30 AM [facility] NURSE [facility]  09/25/2020  9:45 AM [facility]    Catalina Antigua, MD

## 2020-08-30 NOTE — Progress Notes (Signed)
Patient complains of occasional pelvic cramps  

## 2020-08-31 LAB — AFP, SERUM, OPEN SPINA BIFIDA
AFP MoM: 1.34
AFP Value: 45.7 ng/mL
Gest. Age on Collection Date: 15.4 weeks
Maternal Age At EDD: 28.5 yr
OSBR Risk 1 IN: 4273
Test Results:: NEGATIVE
Weight: 139 [lb_av]

## 2020-09-24 ENCOUNTER — Encounter: Payer: Self-pay | Admitting: General Practice

## 2020-09-25 ENCOUNTER — Encounter: Payer: Self-pay | Admitting: *Deleted

## 2020-09-25 ENCOUNTER — Other Ambulatory Visit: Payer: Self-pay

## 2020-09-25 ENCOUNTER — Ambulatory Visit: Payer: Self-pay | Admitting: *Deleted

## 2020-09-25 ENCOUNTER — Ambulatory Visit: Payer: Self-pay | Attending: Family Medicine

## 2020-09-25 ENCOUNTER — Other Ambulatory Visit: Payer: Self-pay | Admitting: *Deleted

## 2020-09-25 VITALS — BP 103/59 | HR 79

## 2020-09-25 DIAGNOSIS — O0990 Supervision of high risk pregnancy, unspecified, unspecified trimester: Secondary | ICD-10-CM

## 2020-09-25 DIAGNOSIS — O09899 Supervision of other high risk pregnancies, unspecified trimester: Secondary | ICD-10-CM

## 2020-09-25 DIAGNOSIS — Z789 Other specified health status: Secondary | ICD-10-CM

## 2020-09-25 DIAGNOSIS — Z362 Encounter for other antenatal screening follow-up: Secondary | ICD-10-CM

## 2020-10-01 ENCOUNTER — Ambulatory Visit (INDEPENDENT_AMBULATORY_CARE_PROVIDER_SITE_OTHER): Payer: Self-pay | Admitting: Obstetrics and Gynecology

## 2020-10-01 ENCOUNTER — Other Ambulatory Visit: Payer: Self-pay

## 2020-10-01 VITALS — BP 98/68 | HR 79 | Wt 144.1 lb

## 2020-10-01 DIAGNOSIS — O09899 Supervision of other high risk pregnancies, unspecified trimester: Secondary | ICD-10-CM

## 2020-10-01 DIAGNOSIS — Z3A20 20 weeks gestation of pregnancy: Secondary | ICD-10-CM | POA: Insufficient documentation

## 2020-10-01 DIAGNOSIS — Z789 Other specified health status: Secondary | ICD-10-CM

## 2020-10-01 DIAGNOSIS — O0990 Supervision of high risk pregnancy, unspecified, unspecified trimester: Secondary | ICD-10-CM

## 2020-10-01 NOTE — Progress Notes (Signed)
Patient stated that she has been having contractions that started around "2 weeks ago, more or less". Patient stated that the pain comes and goes and denies any vaginal bleeding

## 2020-10-01 NOTE — Progress Notes (Signed)
   PRENATAL VISIT NOTE  Subjective:  Sherri [name] is a 30 y.o. G2P0101 at 109w1d being seen today for ongoing prenatal care.  She is currently monitored for the following issues for this high-risk pregnancy and has Supervision of high risk pregnancy, antepartum; History of preterm delivery, currently pregnant; Language barrier; and [redacted] weeks gestation of pregnancy on their problem list.  Patient doing well with no acute concerns today. She reports  multiple vague complaints .  Contractions: Irritability. Vag. Bleeding: None.  Movement: Present. Denies leaking of fluid. She was concerned about contractions;however, when asked the symptoms were more consistent with round ligament pain.Pt was advised if there was any concern about preterm labor, she should be evaluated in the MAU.  Pt also concerned about mastitis from 03/2020.  Pt was treated with antibiotics.  She did have some mild breast tenderness, but she was reassured this can be normal in pregnancy.  The following portions of the patient's history were reviewed and updated as appropriate: allergies, current medications, past family history, past medical history, past social history, past surgical history and problem list. Problem list updated.  Objective:   Vitals:   10/01/20 0826  BP: 98/68  Pulse: 79  Weight: 144 lb 1.6 oz (65.4 kg)    Fetal Status: Fetal Heart Rate (bpm): 142 Fundal Height: 20 cm Movement: Present     General:  Alert, oriented and cooperative. Patient is in no acute distress.  Skin: Skin is warm and dry. No rash noted.   Cardiovascular: Normal heart rate noted  Respiratory: Normal respiratory effort, no problems with respiration noted  Abdomen: Soft, gravid, appropriate for gestational age.  Pain/Pressure: Present     Pelvic: Cervical exam deferred        Extremities: Normal range of motion.  Edema: None  Mental Status:  Normal mood and affect. Normal behavior. Normal judgment and thought content.    Assessment and Plan:  Pregnancy: G2P0101 at [redacted]w[redacted]d  1. [redacted] weeks gestation of pregnancy   2. Supervision of high risk pregnancy, antepartum Continue routine care  3. History of preterm delivery, currently pregnant No s/sx of PTL  4. Language barrier Video interpreter used.  Preterm labor symptoms and general obstetric precautions including but not limited to vaginal bleeding, contractions, leaking of fluid and fetal movement were reviewed in detail with the patient.  Please refer to After Visit Summary for other counseling recommendations.   Return in about 4 weeks (around 10/29/2020) for [facility].   Mariel Aloe, MD Faculty Attending Center for [facility]

## 2020-10-24 ENCOUNTER — Other Ambulatory Visit: Payer: Self-pay

## 2020-10-24 ENCOUNTER — Ambulatory Visit: Payer: Self-pay | Admitting: *Deleted

## 2020-10-24 ENCOUNTER — Encounter: Payer: Self-pay | Admitting: *Deleted

## 2020-10-24 ENCOUNTER — Ambulatory Visit: Payer: Self-pay | Attending: Obstetrics and Gynecology

## 2020-10-24 VITALS — BP 107/61 | HR 82

## 2020-10-24 DIAGNOSIS — Z362 Encounter for other antenatal screening follow-up: Secondary | ICD-10-CM | POA: Insufficient documentation

## 2020-10-24 DIAGNOSIS — Z789 Other specified health status: Secondary | ICD-10-CM | POA: Insufficient documentation

## 2020-10-24 DIAGNOSIS — O0990 Supervision of high risk pregnancy, unspecified, unspecified trimester: Secondary | ICD-10-CM

## 2020-10-24 DIAGNOSIS — O09212 Supervision of pregnancy with history of pre-term labor, second trimester: Secondary | ICD-10-CM

## 2020-10-24 DIAGNOSIS — O09899 Supervision of other high risk pregnancies, unspecified trimester: Secondary | ICD-10-CM

## 2020-10-24 DIAGNOSIS — Z3A23 23 weeks gestation of pregnancy: Secondary | ICD-10-CM

## 2020-10-29 ENCOUNTER — Other Ambulatory Visit: Payer: Self-pay

## 2020-10-29 ENCOUNTER — Ambulatory Visit (INDEPENDENT_AMBULATORY_CARE_PROVIDER_SITE_OTHER): Payer: Self-pay | Admitting: Obstetrics and Gynecology

## 2020-10-29 ENCOUNTER — Encounter: Payer: Self-pay | Admitting: Obstetrics and Gynecology

## 2020-10-29 VITALS — BP 100/66 | HR 89 | Wt 149.2 lb

## 2020-10-29 DIAGNOSIS — O09899 Supervision of other high risk pregnancies, unspecified trimester: Secondary | ICD-10-CM

## 2020-10-29 DIAGNOSIS — Z789 Other specified health status: Secondary | ICD-10-CM

## 2020-10-29 DIAGNOSIS — O0990 Supervision of high risk pregnancy, unspecified, unspecified trimester: Secondary | ICD-10-CM

## 2020-10-29 DIAGNOSIS — Z3A24 24 weeks gestation of pregnancy: Secondary | ICD-10-CM | POA: Insufficient documentation

## 2020-10-29 DIAGNOSIS — Z23 Encounter for immunization: Secondary | ICD-10-CM

## 2020-10-29 NOTE — Progress Notes (Signed)
   PRENATAL VISIT NOTE  Subjective:  Sherri [name] is a 30 y.o. G2P0101 at [redacted]w[redacted]d being seen today for ongoing prenatal care.  She is currently monitored for the following issues for this high-risk pregnancy and has Supervision of high risk pregnancy, antepartum; History of preterm delivery, currently pregnant; Language barrier; [redacted] weeks gestation of pregnancy; and [redacted] weeks gestation of pregnancy on their problem list.  Patient doing well with no acute concerns today. She reports no complaints.  Contractions: Not present. Vag. Bleeding: None.  Movement: Present. Denies leaking of fluid.   Review of chart showed previous delivery of infant at 36.6 weeks.  Cervical length is 3.1 cm  The following portions of the patient's history were reviewed and updated as appropriate: allergies, current medications, past family history, past medical history, past social history, past surgical history and problem list. Problem list updated.  Objective:   Vitals:   10/29/20 0828  BP: 100/66  Pulse: 89  Weight: 149 lb 3.2 oz (67.7 kg)    Fetal Status: Fetal Heart Rate (bpm): 140 Fundal Height: 24 cm Movement: Present     General:  Alert, oriented and cooperative. Patient is in no acute distress.  Skin: Skin is warm and dry. No rash noted.   Cardiovascular: Normal heart rate noted  Respiratory: Normal respiratory effort, no problems with respiration noted  Abdomen: Soft, gravid, appropriate for gestational age.  Pain/Pressure: Absent     Pelvic: Cervical exam deferred        Extremities: Normal range of motion.  Edema: Trace  Mental Status:  Normal mood and affect. Normal behavior. Normal judgment and thought content.   Assessment and Plan:  Pregnancy: G2P0101 at [redacted]w[redacted]d  1. [redacted] weeks gestation of pregnancy   2. Supervision of high risk pregnancy, antepartum Continue routine care  3. Language barrier Live interpreter present  4. History of preterm delivery, currently pregnant No s/sx of  preterm labor  5. Flu vaccine need  - Flu Vaccine QUAD 36+ mos IM (Fluarix, Quad PF)  Preterm labor symptoms and general obstetric precautions including but not limited to vaginal bleeding, contractions, leaking of fluid and fetal movement were reviewed in detail with the patient.  Please refer to After Visit Summary for other counseling recommendations.   Return in about 4 weeks (around 11/26/2020) for ROB, in person, 2 hr GTT, 3rd trim labs.   Mariel Aloe, MD Faculty Attending Center for [facility]

## 2020-11-19 ENCOUNTER — Other Ambulatory Visit: Payer: Self-pay | Admitting: *Deleted

## 2020-11-19 DIAGNOSIS — O0990 Supervision of high risk pregnancy, unspecified, unspecified trimester: Secondary | ICD-10-CM

## 2020-11-26 ENCOUNTER — Ambulatory Visit (INDEPENDENT_AMBULATORY_CARE_PROVIDER_SITE_OTHER): Payer: Self-pay | Admitting: Family Medicine

## 2020-11-26 ENCOUNTER — Other Ambulatory Visit: Payer: Self-pay

## 2020-11-26 VITALS — BP 110/64 | HR 86 | Wt 154.7 lb

## 2020-11-26 DIAGNOSIS — O0990 Supervision of high risk pregnancy, unspecified, unspecified trimester: Secondary | ICD-10-CM

## 2020-11-26 DIAGNOSIS — O09899 Supervision of other high risk pregnancies, unspecified trimester: Secondary | ICD-10-CM

## 2020-11-26 DIAGNOSIS — Z789 Other specified health status: Secondary | ICD-10-CM

## 2020-11-26 NOTE — Patient Instructions (Signed)
Tercer trimestre de embarazo Third Trimester of Pregnancy El tercer trimestre de embarazo va desde la semana 28 hasta la semana 40. Esto corresponde a los meses 7 a 9. El tercer trimestre es un perodo en el que el beb en gestacin (feto) crece rpidamente. Hacia el final del noveno mes, el feto mide alrededor de 20pulgadas (45cm) de largo y pesa entre 6 y 10 libras (2.7 y 4.5kg). Cambios en el cuerpo durante el tercer trimestre Durante el tercer trimestre, su cuerpo contina experimentando numerosos cambios. Los cambios varan y generalmente vuelven a la normalidad despus del nacimiento del beb. Cambios fsicos Seguir aumentando de peso. Es de esperar que aumente entre 25 y 35libras (11 y 16kg) hacia el final del embarazo si inicia el embarazo con un peso normal. Si tiene bajo peso, es de esperar que aumente entre 28 y 40 libras (13 y18 kg), y si tiene sobrepeso, es de esperar que aumente entre 15 y 25 libras (7 y 11kg). Podrn aparecer las primeras estras en las caderas, el abdomen y las mamas. Las mamas seguirn creciendo y pueden doler. Un lquido amarillo (calostro) puede salir de sus pechos. Esta es la primera leche que usted produce para su beb. Tal vez haya cambios en el cabello. Esto cambios pueden incluir su engrosamiento, crecimiento rpido y cambios en la textura. A algunas personas tambin se les cae el cabello durante o despus del embarazo, o tienen el cabello seco o fino. El ombligo puede salir hacia afuera. Puede observar que se le hinchan las manos, el rostro o los tobillos. Cambios en la salud Es posible que tenga acidez estomacal. Puede sufrir estreimiento. Puede desarrollar hemorroides. Puede desarrollar venas hinchadas y abultadas (venas varicosas) en las piernas. Puede presentar ms dolor en la pelvis, la espalda o los muslos. Esto se debe al aumento de peso y al aumento de las hormonas que relajan las articulaciones. Puede presentar un aumento del hormigueo o  entumecimiento en las manos, brazos y piernas. La piel de su abdomen tambin puede sentirse entumecida. Puede sentir que le falta el aire debido a que se expande el tero. Otros cambios Puede tener necesidad de orinar con ms frecuencia porque el feto baja hacia la pelvis y ejerce presin sobre la vejiga. Puede tener ms problemas para dormir. Esto puede deberse al tamao de su abdomen, una mayor necesidad de orinar y un aumento en el metabolismo de su cuerpo. Puede notar que el feto "baja" o lo siente ms bajo, en el abdomen (aligeramiento). Puede tener un aumento de la secrecin vaginal. Puede notar que tiene dolor alrededor del hueso plvico a medida que el tero se distiende. Siga estas instrucciones en su casa: Medicamentos Siga las instrucciones del mdico en relacin con el uso de medicamentos. Durante el embarazo, hay medicamentos que pueden tomarse y otros que no. No tome ningn medicamento a menos que lo haya autorizado el mdico. Tome vitaminas prenatales que contengan por lo menos 600microgramos (mcg) de cido flico. Comida y bebida Lleve una dieta saludable que incluya frutas y verduras frescas, cereales integrales, buenas fuentes de protenas como carnes magras, huevos o tofu, y productos lcteos descremados. Evite la carne cruda y el jugo, la leche y el queso sin pasteurizar. Estos portan grmenes que pueden provocar dao tanto a usted como al beb. Tome 4 o 5 comidas pequeas en lugar de 3 comidas abundantes al da. Es posible que tenga que tomar estas medidas para prevenir o tratar el estreimiento: Beber suficiente lquido como para mantener la orina   de color amarillo plido. Consumir alimentos ricos en fibra, como frijoles, cereales integrales, y frutas y verduras frescas. Limitar el consumo de alimentos ricos en grasa y azcares procesados, como los alimentos fritos o dulces. Actividad Haga ejercicio solamente como se lo haya indicado el mdico. La mayora de las personas  pueden continuar su actividad fsica habitual durante el embarazo. Intente realizar como mnimo 30minutos de actividad fsica por lo menos 5das a la semana. Deje de hacer ejercicio si experimenta contracciones en el tero. Deje de hacer ejercicio si le aparecen dolor o clicos en la parte baja del vientre o de la espalda. Evite levantar pesos excesivos. No haga ejercicio si hace mucho calor o humedad, o si se encuentra a una altitud elevada. Si lo desea, puede seguir teniendo relaciones sexuales, salvo que el mdico le indique lo contrario. Alivio del dolor y del malestar Haga pausas frecuentes y descanse con las piernas levantadas (elevadas) si tiene calambres en las piernas o dolor en la parte baja de la espalda. Dese baos de asiento con agua tibia para aliviar el dolor o las molestias causadas por las hemorroides. Use una crema para las hemorroides si el mdico la autoriza. Use un sujetador que le brinde buen soporte para prevenir las molestias causadas por la sensibilidad en las mamas. Si tiene venas varicosas: Use medias de compresin como se lo haya indicado el mdico. Eleve los pies durante 15minutos, 3 o 4veces por da. Limite el consumo de sal en su dieta. Seguridad Hable con su mdico antes de viajar distancias largas. No se d baos de inmersin en agua caliente, baos turcos ni saunas. Use el cinturn de seguridad en todo momento mientras conduce o va en auto. Hable con el mdico si es vctima de maltrato verbal o fsico. Preparacin para el nacimiento Para prepararse para la llegada de su beb: Tome clases prenatales para entender, practicar, y hacer preguntas sobre el trabajo de parto y el parto. Visite el hospital y recorra el rea de maternidad. Compre un asiento de seguridad orientado hacia atrs, y asegrese de saber cmo instalarlo en su automvil. Prepare la habitacin o el lugar donde dormir el beb. Asegrese de quitar todas las almohadas y animales de peluche de  la cuna del beb para evitar la asfixia. Indicaciones generales Evite el contacto con las bandejas sanitarias de los gatos y la tierra que estos animales usan. Estos alimentos contienen bacterias que pueden causar defectos congnitos en el beb. Si tiene un gato, pdale a alguien que limpie la caja de arena por usted. No se haga lavados vaginales ni use tampones. No use toallas higinicas perfumadas. No consuma ningn producto que contenga nicotina o tabaco, como cigarrillos, cigarrillos electrnicos y tabaco de mascar. Si necesita ayuda para dejar de consumir estos productos, consulte al mdico. No use ningn remedio a base de hierbas, drogas ilegales o medicamentos que no le hayan sido recetados. Las sustancias qumicas de estos productos pueden daar al beb. No beba alcohol. Le realizarn exmenes prenatales ms frecuentes durante el tercer trimestre. Durante una visita prenatal de rutina, el mdico le har un examen fsico, le realizar pruebas y hablar con usted de su salud general. Cumpla con todas las visitas de seguimiento. Esto es importante. Dnde buscar ms informacin American Pregnancy Association (Asociacin Estadounidense del Embarazo): americanpregnancy.org American College of Obstetricians and Gynecologists (Colegio Estadounidense de Obstetras y Gineclogos): acog.org/womens-health/pregnancy? Office on Women's Health (Oficina para la Salud de la Mujer): womenshealth.gov/pregnancy Comunquese con un mdico si tiene: Fiebre. Clicos leves en la   pelvis, presin en la pelvis o dolor persistente en la zona abdominal o la parte baja de la espalda. Vmitos o diarrea. Secrecin vaginal con mal olor u orina con mal olor. Dolor al orinar. Un dolor de cabeza que no desaparece despus de tomar analgsicos. Cambios en la visin o ve manchas delante de los ojos. Solicite ayuda de inmediato si: Rompe la bolsa. Tiene contracciones regulares separadas por menos de 5minutos. Tiene sangrado o  pequeas prdidas vaginales. Siente un dolor abdominal intenso. Tiene dificultad para respirar. Siente dolor en el pecho. Sufre episodios de desmayo. No ha sentido a su beb moverse durante el perodo de tiempo que le indic el mdico. Tiene dolor, hinchazn o enrojecimiento nuevos en un brazo o una pierna o se produce un aumento de alguno de estos sntomas. Resumen El tercer trimestre del embarazo comprende desde la semana28 hasta la semana 40 (desde el mes7 hasta el mes9). Puede tener ms problemas para dormir. Esto puede deberse al tamao de su abdomen, una mayor necesidad de orinar y un aumento en el metabolismo de su cuerpo. Le realizarn exmenes prenatales ms frecuentes durante el tercer trimestre. Cumpla con todas las visitas de seguimiento. Esto es importante. Esta informacin no tiene como fin reemplazar el consejo del mdico. Asegrese de hacerle al mdico cualquier pregunta que tenga. Document Revised: 07/01/2019 Document Reviewed: 07/01/2019 Elsevier Patient Education  2022 Elsevier Inc.  

## 2020-11-26 NOTE — Progress Notes (Signed)
   PRENATAL VISIT NOTE  Subjective:  Sherri [name] is a 30 y.o. G2P0101 at [redacted]w[redacted]d being seen today for ongoing prenatal care.  She is currently monitored for the following issues for this high-risk pregnancy and has Supervision of high risk pregnancy, antepartum; History of preterm delivery, currently pregnant; and Language barrier on their problem list.  Patient reports no complaints.  Contractions: Not present. Vag. Bleeding: None.  Movement: Present. Denies leaking of fluid.   The following portions of the patient's history were reviewed and updated as appropriate: allergies, current medications, past family history, past medical history, past social history, past surgical history and problem list.   Objective:   Vitals:   11/26/20 0823  BP: 110/64  Pulse: 86  Weight: 154 lb 11.2 oz (70.2 kg)    Fetal Status: Fetal Heart Rate (bpm): 134 Fundal Height: 27 cm Movement: Present     General:  Alert, oriented and cooperative. Patient is in no acute distress.  Skin: Skin is warm and dry. No rash noted.   Cardiovascular: Normal heart rate noted  Respiratory: Normal respiratory effort, no problems with respiration noted  Abdomen: Soft, gravid, appropriate for gestational age.  Pain/Pressure: Present     Pelvic: Cervical exam deferred        Extremities: Normal range of motion.  Edema: Trace  Mental Status: Normal mood and affect. Normal behavior. Normal judgment and thought content.   Assessment and Plan:  Pregnancy: G2P0101 at [redacted]w[redacted]d 1. Language barrier Spanish interpreter: Raquel used   2. History of preterm delivery, currently pregnant At 36 6/7 weeks No s/sx's of PTL  3. Supervision of high risk pregnancy, antepartum 28 wk labs and TDaP (at [facility]) today  Preterm labor symptoms and general obstetric precautions including but not limited to vaginal bleeding, contractions, leaking of fluid and fetal movement were reviewed in detail with the patient. Please refer to After  Visit Summary for other counseling recommendations.   Return in 2 weeks (on 12/10/2020).  No future appointments.  Reva Bores, MD

## 2020-11-27 LAB — CBC
Hematocrit: 31.4 % — ABNORMAL LOW (ref 34.0–46.6)
Hemoglobin: 10.2 g/dL — ABNORMAL LOW (ref 11.1–15.9)
MCH: 27 pg (ref 26.6–33.0)
MCHC: 32.5 g/dL (ref 31.5–35.7)
MCV: 83 fL (ref 79–97)
Platelets: 253 10*3/uL (ref 150–450)
RBC: 3.78 x10E6/uL (ref 3.77–5.28)
RDW: 13.4 % (ref 11.7–15.4)
WBC: 7.7 10*3/uL (ref 3.4–10.8)

## 2020-11-27 LAB — GLUCOSE TOLERANCE, 2 HOURS W/ 1HR
Glucose, 1 hour: 122 mg/dL (ref 70–179)
Glucose, 2 hour: 89 mg/dL (ref 70–152)
Glucose, Fasting: 80 mg/dL (ref 70–91)

## 2020-11-27 LAB — HIV ANTIBODY (ROUTINE TESTING W REFLEX): HIV Screen 4th Generation wRfx: NONREACTIVE

## 2020-11-27 LAB — RPR: RPR Ser Ql: NONREACTIVE

## 2020-12-10 ENCOUNTER — Ambulatory Visit (INDEPENDENT_AMBULATORY_CARE_PROVIDER_SITE_OTHER): Payer: Self-pay | Admitting: Obstetrics and Gynecology

## 2020-12-10 ENCOUNTER — Other Ambulatory Visit: Payer: Self-pay

## 2020-12-10 ENCOUNTER — Encounter: Payer: Self-pay | Admitting: Obstetrics and Gynecology

## 2020-12-10 VITALS — BP 101/70 | HR 109 | Wt 156.0 lb

## 2020-12-10 DIAGNOSIS — Z789 Other specified health status: Secondary | ICD-10-CM

## 2020-12-10 DIAGNOSIS — O0990 Supervision of high risk pregnancy, unspecified, unspecified trimester: Secondary | ICD-10-CM

## 2020-12-10 DIAGNOSIS — O09899 Supervision of other high risk pregnancies, unspecified trimester: Secondary | ICD-10-CM

## 2020-12-10 MED ORDER — FERROUS SULFATE 325 (65 FE) MG PO TABS: 325.0000 mg | ORAL_TABLET | ORAL | 1 refills | Status: DC

## 2020-12-10 NOTE — Progress Notes (Signed)
Subjective:  Sherri [name] is a 30 y.o. G2P0101 at [redacted]w[redacted]d being seen today for ongoing prenatal care.  She is currently monitored for the following issues for this low-risk pregnancy and has Supervision of high risk pregnancy, antepartum; History of preterm delivery, currently pregnant; and Language barrier on their problem list.  Patient reports general discomforts of pregnancy.  Contractions: Not present. Vag. Bleeding: None.  Movement: Present. Denies leaking of fluid.   The following portions of the patient's history were reviewed and updated as appropriate: allergies, current medications, past family history, past medical history, past social history, past surgical history and problem list. Problem list updated.  Objective:   Vitals:   12/10/20 1108  BP: 101/70  Pulse: (!) 109  Weight: 156 lb (70.8 kg)    Fetal Status: Fetal Heart Rate (bpm): 131   Movement: Present     General:  Alert, oriented and cooperative. Patient is in no acute distress.  Skin: Skin is warm and dry. No rash noted.   Cardiovascular: Normal heart rate noted  Respiratory: Normal respiratory effort, no problems with respiration noted  Abdomen: Soft, gravid, appropriate for gestational age. Pain/Pressure: Present     Pelvic:  Cervical exam deferred        Extremities: Normal range of motion.  Edema: Trace  Mental Status: Normal mood and affect. Normal behavior. Normal judgment and thought content.   Urinalysis:      Assessment and Plan:  Pregnancy: G2P0101 at [redacted]w[redacted]d  1. Supervision of high risk pregnancy, antepartum Stable - ferrous sulfate (FERROUSUL) 325 (65 FE) MG tablet; Take 1 tablet (325 mg total) by mouth every other day.  Dispense: 60 tablet; Refill: 1  2. History of preterm delivery, currently pregnant No S/Sx at present  3. Language barrier Live interrupter used during today's visit  Preterm labor symptoms and general obstetric precautions including but not limited to vaginal bleeding,  contractions, leaking of fluid and fetal movement were reviewed in detail with the patient. Please refer to After Visit Summary for other counseling recommendations.  Return in about 2 weeks (around 12/24/2020) for OB visit, face to face, any provider.   Hermina Staggers, MD

## 2020-12-10 NOTE — Patient Instructions (Addendum)
Tercer trimestre de Psychiatrist Third Trimester of Pregnancy El tercer trimestre de embarazo va desde la semana 28 hasta la semana 40. Tambin se dice que va desde el mes 7 hasta el mes 9. En este trimestre, el beb en gestacin (feto) crece muy rpidamente. Hacia el final del noveno mes, el beb en gestacin mide alrededor de 20 pulgadas (45 cm) de largo. Pesa entre 6 y 42 libras (2,70 y 4,50 kg). Cambios en el cuerpo durante el tercer trimestre Su organismo contina atravesando por muchos cambios durante este perodo. Los cambios varan y generalmente vuelven a la normalidad despus del nacimiento del beb. Cambios fsicos Seguir American Standard Companies. Puede ser que aumente entre 25 y 35 libras (11 y 16 kg) hacia el final del Psychiatrist. Si tiene Sanmina-SCI, puede aumentar entre 28 y 40 lb (unos 13 a 18 kg). Si tiene sobrepeso, puede aumentar entre 15 y 25 libras (unos 7 a 11 kg). Podrn aparecer las primeras estras en las caderas, el vientre (abdomen) y las Avalon. Las ConAgra Foods seguirn creciendo y Writer. Un lquido amarillo Charity fundraiser) puede salir de sus pechos. Esta es la primera leche que usted produce para el beb. Tal vez haya cambios en el cabello. El ombligo puede salir hacia afuera. Puede observar que se le hinchan ms las [address], la cara o los tobillos. Cambios en la salud Es posible que tenga acidez estomacal. Es posible que tenga dificultades para defecar (estreimiento). Pueden aparecerle hemorroides. Estas son venas hinchadas en el ano que pueden picar o doler. Puede comenzar a tener venas hinchadas (vrices) en las piernas. Puede presentar ms dolor en la pelvis, la espalda o los muslos. Puede presentar ms hormigueo o entumecimiento en las manos, los brazos y las piernas. La piel de su vientre tambin puede sentirse entumecida. Es posible que sienta falta de aire a medida que el tero se [place]. Otros cambios Es posible que haga pis (orine) con mayor frecuencia. Puede tener ms  problemas para dormir. Puede notar que el beb en gestacin "baja" o se mueve ms hacia bajo, en el vientre. Puede notar ms secrecin proveniente de la vagina. Puede sentir las articulaciones flojas y Production assistant, radio alrededor del hueso plvico. Siga estas instrucciones en su casa: Medicamentos Use los medicamentos de venta libre y los recetados solamente como se lo haya indicado el mdico. Algunos medicamentos no son seguros Academic librarian. Tome vitaminas prenatales que contengan por lo menos 600 microgramos (mcg) de cido flico. Comida y bebida Consuma comidas saludables que incluyan lo siguiente: Nils Pyle y verduras frescas. Cereales integrales. Buenas fuentes de protenas, como carne, huevos y tofu. Productos lcteos con bajo contenido de Tradewinds. Evite la carne cruda y el [place], la Forest y el queso sin Market researcher. Estos portan grmenes que pueden provocar dao tanto a usted como al beb. Tome 4 o 5 comidas pequeas en lugar de 3 comidas abundantes al da. Es posible que deba tomar medidas para prevenir o tratar los problemas para defecar: Product manager suficiente lquido para Radio producer pis (orina) de color amarillo plido. Come alimentos ricos en fibra. Entre ellos, frijoles, cereales integrales y frutas y verduras frescas. Limitar los alimentos con alto contenido de grasa y International aid/development worker. Estos incluyen alimentos fritos o dulces. Actividad Haga ejercicios solamente como se lo haya indicado el mdico. Interrumpa la actividad fsica si comienza a tener clicos en el tero. Evite levantar pesos Fortune Brands. No haga ejercicio si hace demasiado calor, hay demasiada humedad o se encuentra en un lugar de Building surveyor (  altitud elevada). Si lo desea, puede continuar teniendo Office Depot, a menos que el mdico le indique lo contrario. Alivio del dolor y del malestar Haga pausas con frecuencia y descanse con las piernas levantadas (elevadas) si tiene calambres en las piernas o dolor en la parte  baja de la espalda. Dese baos de asiento con agua tibia para Best boy o las molestias causadas por las hemorroides. Use una crema para las hemorroides si el mdico la autoriza. Use un sostn que le brinde buen soporte si sus mamas estn sensibles. Si desarrolla venas hinchadas y abultadas en las piernas: Use medias de compresin segn las indicaciones de su mdico. Levante los pies durante 15 minutos, 3 o 4 veces por Training and development officer. Limite la sal en sus alimentos. Seguridad Hable con el mdico antes de Control and instrumentation engineer. No se d baos de inmersin en agua caliente, baos turcos ni saunas. Use el cinturn de seguridad en todo momento mientras vaya en auto. Hable con el mdico si alguien le est haciendo dao o gritando [place]. Preparacin para la llegada del beb Para prepararse para la llegada de su beb: Tome clases prenatales. Visite el hospital y recorra el rea de maternidad. Compre un asiento de seguridad AutoNation atrs para llevar al beb en el automvil. Aprenda cmo instalarlo en el auto. Prepare la habitacin del beb. Saque todas las almohadas y los animales de peluche de la cuna del beb. Instrucciones generales Evite el contacto con las bandejas sanitarias de los gatos y la tierra que estos animales usan. Estos contienen grmenes que pueden daar al beb y causar la prdida del beb ya sea aborto espontneo o muerte fetal. No se haga duchas vaginales ni use tampones. No use tampones ni toallas higinicas perfumadas. No fume ni consuma ningn producto que contenga nicotina o tabaco. Si necesita ayuda para dejar de fumar, consulte al mdico. No beba alcohol. No use medicamentos a base de hierbas, drogas ilegales, ni medicamentos que el mdico no haya autorizado. Las sustancias qumicas de estos productos pueden afectar al beb. Cumpla con todas las visitas de seguimiento. Esto es importante. Dnde buscar ms informacin American Pregnancy Association (Asociacin  Americana del Embarazo): americanpregnancy.org SPX Corporation of Obstetricians and Gynecologists (Colegio Estadounidense de Obstetras y Gineclogos): www.acog.org Office on Home Depot ([place]): KeywordPortfolios.com.br Comunquese con un mdico si: Tiene fiebre. Tiene clicos leves o siente presin en la parte baja del vientre. Sufre un dolor persistente en el abdomen. Vomita o hace deposiciones acuosas (diarrea). Advierte lquido con mal olor que proviene de la vagina. Siente dolor al orinar o hace orina con mal olor. Tiene un dolor de cabeza que no desaparece despus de Teacher, adult education. Nota cambios en la visin o ve manchas delante de los ojos. Solicite ayuda de inmediato si: Rompe la bolsa. Tiene contracciones regulares separadas por menos de 5 minutos. Tiene sangrado o pequeas prdidas vaginales. Tiene clicos o dolor muy intensos en el vientre. Tiene dificultad para respirar. Sientes dolor en el pecho. Se desmaya. No ha sentido al beb moverse durante el tiempo que le indic el mdico. Tiene dolor, hinchazn o enrojecimiento nuevos en un brazo o una pierna o se produce un aumento de alguno de estos sntomas. Resumen El tercer trimestre comprende desde la semana 28 hasta la semana 40 (desde el mes 7 hasta el mes 9). Esta es la poca en que el beb en gestacin crece muy rpidamente. Durante este perodo, las molestias pueden aumentar a medida que usted sube  de peso y el beb crece. Preprese para la llegada del beb: asista a las clases prenatales, compre un asiento de seguridad orientado hacia atrs para llevar al beb en auto y prepare la habitacin del beb. Solicite ayuda de inmediato si tiene sangrado por la vagina, siente dolor en el pecho y tiene dificultad para respirar, o si no ha sentido al beb moverse durante el tiempo que le indic el mdico. Esta informacin no tiene Theme park manager el consejo del mdico. Asegrese de hacerle al  mdico cualquier pregunta que tenga. Document Revised: 07/06/2019 Document Reviewed: 07/06/2019 Elsevier Patient Education  2022 Elsevier Inc.    AREA PEDIATRIC/FAMILY PRACTICE PHYSICIANS  Central/[place]outheast [place] ([address]) [facility] Deirdre Priest, MD; Lum Babe, MD; Sheffield Slider, MD; Leveda Anna, MD; McDiarmid, MD; Jerene Bears, MD; Jennette Kettle, MD; Gwendolyn Grant, MD [address] [place] [place], [place] [address] [phone] Mon-Fri 8:30-12:30, 1:30-5:00 Providers come to see babies at [facility] Accepting [facility] Family [facility] at [facility] [facility] providers who accept newborns: Docia Chuck, MD; Kateri Plummer, MD; Paulino Rily, MD [address], [place], [place] [address] [phone] Mon-Fri 8:00-5:30 Babies seen by providers at [facility] [place] [facility] Does NOT accept Medicaid Please call early in hospitalization for appointment (limited availability)  [facility] [place], MD [address] [place], [place] [address] [phone] Mon, Tue, Thur, Fri 8:30-5:00, Wed 10:00-7:00 (closed 1-2pm) Babies seen by [facility] providers Accepting Medicaid Donnie Coffin - Pediatrician Donnie Coffin, MD [address], [place], [place] [address] [phone] Mon-Fri 8:30-5:00, Sat 8:30-12:00 Provider comes to see babies at [facility] Accepting Medicaid Must have been referred from current patients or contacted office prior to delivery [facility]) Manson Passey, MD; Ave Filter, MD; Luna Fuse, MD; Kennedy Bucker, MD; Konrad Dolores, MD; Kathlene November, MD; Jenne Campus, MD; Lubertha South, MD; Wynetta Emery, MD; Duffy Rhody, MD; Gerre Couch, NP; Shirl Harris, NP [address] [place], [place], [place] [address] [phone] Mon, Halford Decamp, Thur, Fri 8:30-5:30, Wed 9:30-5:30, Sat 8:30-12:30 Babies seen by [facility] providers Accepting Medicaid Only accepting infants of first-time parents or siblings of current patients Hospital discharge  coordinator will make follow-up appointment Cyril Mourning [address] [place], [place]  [address] [phone]   Fax - [phone] [facility] [address], [place], [place]  [address] Phone - [phone]   Fax - [phone] Lucio Edward [address] [place], [place]  [address] [phone]  [place] [phone]) [facility] Jenne Pane, MD; Alita Chyle, MD; Princella Ion, MD; MD; Earlene Plater, MD; Jamesetta Orleans, MD; Alvera Novel, MD; Clarene Duke, MD; Rana Snare, MD; Carmon Ginsberg, MD; Alinda Money, MD; Hosie Poisson, MD; Mayford Knife, MD [address], [place], [place] [address] [phone] Mon-Fri 8:30-5:00 (extended evenings Mon-Thur as needed), Sat-Sun 10:00-1:00 Providers come to see babies at [facility] Accepting Medicaid for families of first-time babies and families with all children in the household age 54 and under. Must register with office prior to making appointment (M-F only). [facility] Suezanne Jacquet, NP; Lynelle Doctor, MD; Susann Givens, MD; [place], [place] [address] [place], [place] [address] [phone] Mon-Fri 8:00-5:00 Babies seen by providers at [facility] Does NOT accept Medicaid/Commercial Insurance Only [facility] [place] [facility])  Holly Bodily, MD; Zachery Dauer, MD; Stefan Church, MD; Sabino Dick, MD; Quitman Livings, MD; Farris Has, MD; Gaynell Face, MD; Betha Loa, MD; Colon Flattery, MD; Clifton James, MD [address] [place] [place], [place] [address] [phone] Mon-Fri 8:30-5:30, Sat (Oct.-Mar.) 9:00-1:00 Babies seen by providers at [facility] Accepting [facility]  [place] [phone]) [facility],  MD; Sheliah Hatch, MD [address], [place], [place] [address] [phone] Mon-Fri 8:30-5:00, Sat 8:30-12:00 Providers come to see babies at [facility] Does NOT accept [facility] Medicine at Lutricia Feil, [place]; Tracie Harrier, MD; [place], [place]; Wynelle Link, MD; Azucena Cecil, MD [address]  [place], [place] [address] [phone] Mon-Fri 8:00-5:00 Babies seen by providers at [facility] Does NOT accept Medicaid Only accepting babies of parents who are patients Please call early in hospitalization for appointment (limited availability) [facility] Pediatricians Chestine Spore, MD; Abran Cantor, MD; Early Osmond, MD; Cherre Huger, NP; Hyacinth Meeker, MD; Dwan Bolt, MD; Jarold Motto, NP; Dario Guardian, MD; Talmage Nap, MD; Maisie Fus, MD; Pricilla Holm, MD; Tama High, MD [address] [place], [place], [place] [address] [phone] Mon-Fri 8:00-5:00, Sat 9:00-12:00 Providers come to see babies at [facility] Does NOT accept [facility] [phone]) [facility] Medicine at [facility] providers accepting new patients: Drema Pry, NP; Delena Serve, PA [address] [place], [place] [address] [phone] Mon-Fri 8:00-5:00 Babies seen by providers at [facility] Does NOT accept Medicaid Only accepting babies of parents who are patients Please call early in hospitalization for appointment (limited availability) [facility] Cardell Peach, MD; Nash Dimmer, MD [address] [place] [place], [place] [address] [phone] (press 1 to schedule appointment) Mon-Fri 8:00-5:00 Providers come to see babies at [facility] Does NOT accept [facility], MD [address] [place], [place] [address] [phone] Mon-Fri 8:30-5:00 (lunch 12:30-1:00), extended hours by appointment only Wed 5:00-6:30 Babies seen by [facility] providers Accepting Medicaid [facility] Carmine, MD; [place], MD; Hassan Rowan, MD [address] [place], [place], [place] [address] [phone] Mon-Fri 8:00-5:00 Babies seen by [facility] providers Does NOT accept Medicaid [facility] Boykin Peek, MD; Durene Cal, MD; [place], [place] [address] [place], [place] [address] [phone] Mon-Fri 8:00-5:00 Babies seen by [facility] providers Does NOT accept  [facility] [place], [place]; [place], [place]; [place], [place]; Avis Epley, MD; Vonna Kotyk, MD; Clance Boll, MD; Stevphen Rochester, NP; Arvilla Market, NP; Ann Maki, NP; Otis Dials, NP; Vaughan Basta, MD; Eartha Inch, MD [address] [place], [place] [address] [phone] Mon-Fri 8:30-5:00, Sat 10:00-1:00 Providers come to see babies at [facility] Does NOT accept Medicaid Free prenatal information session Tuesdays at 4:45pm [facility] [place], MD; [place], [place]; [place], [place]; [place], [place] [address] [place] [address] [phone] Mon-Fri 7:30-5:30 Babies seen by [facility] providers [facility] [address], [place], [place]  [address] [phone]   Fax - [phone]  [place] [phone] & [phone]) [facility], MD [address] [place], [place] [address] [phone] Mon-Thur 8:00-6:00 Providers come to see babies at [facility] Accepting Medicaid [facility] Dareen Piano, NP; Cyndia Bent, MD; [place], [place]; [place], [place] [address] [place], [place] [address] [phone] Mon-Thur 7:30-7:30, Fri 7:30-4:30 Babies seen by [facility] providers Accepting [facility] Juanito Doom, MD; Janene Harvey, NP; Vonita Moss, MD [address], [place], [place] [address] [phone] Mon-Fri 8:30-5:00, Sat 8:30-12:00 Providers come to see babies at [facility] Accepting Medicaid Must have "Meet & Greet" appointment at office prior to delivery [facility] ([facility]) Marlow Baars, MD; Earlene Plater, MD; Lucretia Roers, MD [address], [place], [place] [address] [phone] Mon-Wed 8:00-6:00, Thur-Fri 8:00-5:00, Sat 9:00-12:00 Providers come to see babies at [facility] Does NOT accept Medicaid Only accepting siblings of current patients [facility]  [address], [place], [place]   [address] [phone]   Fax -  [phone] [facility] Family Medicine at [place] [facility] [address] [place], [place]  [address] [phone]   Fax - [phone]  [place] [phone] & [phone]) Adult nurse HealthCare at [facility], [place]; [place], [place] [address] [place], [place] [address] [phone] Mon-Fri 7:00-5:00 Babies seen by [facility] providers Does NOT accept Medicaid [facility] More[place], MD; [place], [place]; [place], [place] [address], [place], [place] [address] [phone] Mon-Fri 8:00-5:00 Babies seen by [facility] providers Accepting [facility] - [place] Lodge [place], MD; [place], [place]; [place], NP; [place], [place] [address] [place], [place], [place] [address] [phone] Mon-Fri 8:00-5:00 Babies seen by providers at [facility] Accepting [facility] [place] [phone]) [facility] Primary Care at [facility] [place], [place] [address] [place], [place] [address] [phone] Mon-Fri 8:00-5:00 Babies seen by [facility] providers Does NOT accept Medicaid Limited availability, please call early in hospitalization to schedule follow-up [facility] Jeanelle Malling, [place]; Eddie Candle, MD; Normand Sloop, MD; [place], [place]; Constance Goltz, MD; [place], [place] [address] [facility] [address], [place], [place] [address] [phone] Mon-Fri 8:30-5:00, Sat 9:00-12:00 Babies seen by providers at [facility] Accepting Medicaid Please register online then schedule online or call office [email or web address] [facility] - [facility] at [facility]) Durene Cal, NP; Lucianne Muss, MD; [place], [place] [address], [place], [place] [address] [phone] Mon-Fri 8:00-5:00 Babies seen by providers at [facility] Accepting [facility] [place] ([facility] [place]) [place], MD; Reed Breech, NP; Shelva Majestic, MD [address], [place], [place] [address] [phone] Mon-Fri 8:00-5:30, Sat&Sun by appointment (phones open at 8:30) Babies seen by [facility] providers Accepting Medicaid Must be a first-time baby or sibling of current patient [facility]  [address], [place], [place]  [address] [phone]   Fax - [phone]  [address] [phone] & [phone]) [facility] [place], [place]; [place], [place]; [place], MD; [place], [place]; Carolyne Fiscal, MD [address] [place], [place] [address] [phone] Mon-Thur 8:00-7:00, Fri 8:00-5:00, Sat 8:00-12:00, Sun 9:00-12:00 Babies seen by [facility] providers Accepting Medicaid [facility] Medicine at Liana Gerold, MD; Gaynell Face, MD; [facility], MD [address], [place], [place] [address] [phone] Mon-Thur 8:00-5:00 Babies seen by providers at [facility] Accepting Medicaid [facility] Family Medicine at [place] Sawyer, MD; [place], MD; Madilyn Fireman, MD; Melvyn Neth, MD; List, MD; Lazarus Salines, MD; Gaynell Face, MD; Berneda Rose, MD; Flora Lipps, MD; Beryl Meager, MD; Luther Redo, MD; Lavonia Drafts, MD; Kellie Simmering, MD [address] [place], [place] [address] [phone] Mon-Fri 8:00-5:30, Sat (Oct.-Mar.) 9:00-1:00 Babies seen by providers at [facility] Accepting Medicaid Must fill out new patient packet, available online at [email or web address] [facility] - [place] [facility] Pediatrics at [facility]) Spero Geralds, NP; Tiburcio Pea, NP; Tresa Endo, NP; Whitney Post, MD; [place], [place]; Hennie Duos, MD; [place], MD; Kavin Leech, NP [address], [place], [place] [address] [phone] Mon-Thur 8:00-5:30, Fri 8:00-5:00 Babies seen by providers at [facility] Accepting [facility]  [place] [phone]) [facility] [place], [place]; [place], MD; [place], MD; [place], [place] [address] [facility] [address] [place], [place]  [address] [phone] Mon-Fri 8:00-5:00 Babies seen by providers at [facility] Accepting [facility]   [place] [phone]) [place] at [facility], [place]; Lenise Arena, MD; [place], [place] [address], [place], [place] [address] [phone] Mon-Fri 8:00-5:00 Babies seen  by providers at [facility] Does NOT accept Medicaid Limited appointment availability, please call early in hospitalization  [facility], [place]; [place], MD [address], [place], [place] [address] [phone] Mon-Fri 8:00-5:00 Babies seen by [facility] providers Does NOT accept [facility] [place], MD; Ninetta Lights, MD; [place], [place]; [place], MD [address], [place], [place] [address] [phone] Mon-Fri 8:00-5:00 After hours clinic [facility] [place], [place] [address]) [phone] Mon-Fri 5:00-8:00, Sat 12:00-6:00, Sun 10:00-4:00 Babies seen by [facility] providers Accepting [facility] at [facility] [address], [place], [place]  [address] [phone]   Fax - [phone]  [place] [phone]) Adult nurse HealthCare at [facility], MD [address] [place], [place], [place] [address] [phone] Mon-Fri 8:00-5:00 Babies seen by [facility] providers Does NOT accept Medicaid [facility] [place] [facility] at [place]) Rene Kocher, MD [address] [place] [address] [place], [place] [address] [phone] Mon-Thur 8:00-7:00, Fri 8:00-5:00, Sat 8:00-12:00 Babies seen by providers at [facility] Accepting Medicaid - but does not have vaccinations in office (must be received elsewhere) Limited availability, please call early in hospitalization  [place] [phone]) [facility]  Wyvonne Lenz, MD [address] [address] [place] [address] [phone]  Fax [phone]  [facility]  Lyndel Safe, MD, [place], [place], [place], [place] [address], [address] [place], [place] [address] [phone] [facility]  [address] Sherian Maroon [place], [place] [address] [phone] [address] [place], [place] [address] [phone] [facility] Office)  [facility] [address], [place], [place] [address] [phone] [facility] [address] [place], [place], [place] [address] [phone] [facility] [address], [place], [place] [address] [phone] [facility] [address] [place], [place] [address] [phone] [facility] [address], [place], [place] [address] [phone] [facility] [address], [place], [place] [address] [phone] [facility]  [address] [place], [place] [address] [phone] Dr. Belia Heman. [address], [place], [place] [address] [phone] [facility] [address], [place], [place] [address] [phone] [facility] [address] [place], [place] [address] [phone]

## 2020-12-27 ENCOUNTER — Other Ambulatory Visit: Payer: Self-pay

## 2020-12-27 ENCOUNTER — Ambulatory Visit (INDEPENDENT_AMBULATORY_CARE_PROVIDER_SITE_OTHER): Payer: Self-pay | Admitting: Obstetrics and Gynecology

## 2020-12-27 VITALS — BP 97/65 | HR 84 | Wt 153.8 lb

## 2020-12-27 DIAGNOSIS — Z789 Other specified health status: Secondary | ICD-10-CM

## 2020-12-27 DIAGNOSIS — O0990 Supervision of high risk pregnancy, unspecified, unspecified trimester: Secondary | ICD-10-CM

## 2020-12-27 DIAGNOSIS — O09899 Supervision of other high risk pregnancies, unspecified trimester: Secondary | ICD-10-CM

## 2020-12-27 DIAGNOSIS — Z5941 Food insecurity: Secondary | ICD-10-CM

## 2020-12-27 DIAGNOSIS — Z3A32 32 weeks gestation of pregnancy: Secondary | ICD-10-CM | POA: Insufficient documentation

## 2020-12-27 NOTE — Progress Notes (Signed)
° °  PRENATAL VISIT NOTE  Subjective:  Sherri [name] is a 30 y.o. G2P0101 at [redacted]w[redacted]d being seen today for ongoing prenatal care.  She is currently monitored for the following issues for this high-risk pregnancy and has Supervision of high risk pregnancy, antepartum; History of preterm delivery, currently pregnant; Language barrier; and [redacted] weeks gestation of pregnancy on their problem list.  Patient doing well with no acute concerns today. She reports backache.  Contractions: Not present. Vag. Bleeding: None.  Movement: Present. Denies leaking of fluid.   The following portions of the patient's history were reviewed and updated as appropriate: allergies, current medications, past family history, past medical history, past social history, past surgical history and problem list. Problem list updated.  Objective:   Vitals:   12/27/20 1541  BP: 97/65  Pulse: 84  Weight: 153 lb 12.8 oz (69.8 kg)    Fetal Status: Fetal Heart Rate (bpm): 130 Fundal Height: 32 cm Movement: Present     General:  Alert, oriented and cooperative. Patient is in no acute distress.  Skin: Skin is warm and dry. No rash noted.   Cardiovascular: Normal heart rate noted  Respiratory: Normal respiratory effort, no problems with respiration noted  Abdomen: Soft, gravid, appropriate for gestational age.  Pain/Pressure: Present     Pelvic: Cervical exam deferred        Extremities: Normal range of motion.  Edema: Trace  Mental Status:  Normal mood and affect. Normal behavior. Normal judgment and thought content.   Assessment and Plan:  Pregnancy: G2P0101 at [redacted]w[redacted]d  1. Food insecurity  - AMBULATORY REFERRAL TO [facility]  2. [redacted] weeks gestation of pregnancy   3. Supervision of high risk pregnancy, antepartum Continue routine care  4. History of preterm delivery, currently pregnant Pt notes 2-3 ctx/ day, no organized contractions  5. Language barrier Live interpreter present  Preterm labor symptoms  and general obstetric precautions including but not limited to vaginal bleeding, contractions, leaking of fluid and fetal movement were reviewed in detail with the patient.  Please refer to After Visit Summary for other counseling recommendations.   Return in about 2 weeks (around 01/10/2021) for [facility], in person.   Mariel Aloe, MD Faculty Attending Center for [facility]

## 2021-01-04 ENCOUNTER — Other Ambulatory Visit: Payer: Self-pay

## 2021-01-04 ENCOUNTER — Ambulatory Visit (INDEPENDENT_AMBULATORY_CARE_PROVIDER_SITE_OTHER): Payer: Self-pay | Admitting: Obstetrics and Gynecology

## 2021-01-04 VITALS — BP 105/76 | HR 81 | Wt 160.0 lb

## 2021-01-04 DIAGNOSIS — O09899 Supervision of other high risk pregnancies, unspecified trimester: Secondary | ICD-10-CM

## 2021-01-04 DIAGNOSIS — Z5941 Food insecurity: Secondary | ICD-10-CM

## 2021-01-04 DIAGNOSIS — Z3A33 33 weeks gestation of pregnancy: Secondary | ICD-10-CM

## 2021-01-04 DIAGNOSIS — O0990 Supervision of high risk pregnancy, unspecified, unspecified trimester: Secondary | ICD-10-CM

## 2021-01-04 DIAGNOSIS — Z789 Other specified health status: Secondary | ICD-10-CM

## 2021-01-04 NOTE — Progress Notes (Signed)
Patient reports pain in "vaginal area". She stated that she has been feeling this pain for about 2 weeks now and denies any vaginal bleeding/abnormal discharge.

## 2021-01-04 NOTE — Progress Notes (Signed)
° °  PRENATAL VISIT NOTE  Subjective:  Sherri [name] is a 30 y.o. G2P0101 at [redacted]w[redacted]d being seen today for ongoing prenatal care.  She is currently monitored for the following issues for this high-risk pregnancy and has Supervision of high risk pregnancy, antepartum; History of preterm delivery, currently pregnant; and Language barrier on their problem list.  Patient reports  see RN note .  Contractions: Irritability. Vag. Bleeding: None.  Movement: Present. Denies leaking of fluid.   The following portions of the patient's history were reviewed and updated as appropriate: allergies, current medications, past family history, past medical history, past social history, past surgical history and problem list.   Objective:   Vitals:   01/04/21 0824  BP: 105/76  Pulse: 81  Weight: 160 lb (72.6 kg)    Fetal Status: Fetal Heart Rate (bpm): 153 Fundal Height: 33 cm Movement: Present  Presentation: Vertex  General:  Alert, oriented and cooperative. Patient is in no acute distress.  Skin: Skin is warm and dry. No rash noted.   Cardiovascular: Normal heart rate noted  Respiratory: Normal respiratory effort, no problems with respiration noted  Abdomen: Soft, gravid, appropriate for gestational age.  Pain/Pressure: Present     Pelvic: Cervical exam deferred        Extremities: Normal range of motion.  Edema: None  Mental Status: Normal mood and affect. Normal behavior. Normal judgment and thought content.   Assessment and Plan:  Pregnancy: G2P0101 at [redacted]w[redacted]d 1. Food insecurity - AMBULATORY REFERRAL TO [facility]  2. History of preterm delivery, currently pregnant At 36/6. Not currently on any medications  3. Supervision of high risk pregnancy, antepartum Vaginal discomfort 1/10 for past few weeks. Likely normal pregnancy discomfort. Precautions given and pregnancy belt advised.   4. Language barrier Interpreter used  5. [redacted] weeks gestation of pregnancy Gbs nv if  36wks  Preterm labor symptoms and general obstetric precautions including but not limited to vaginal bleeding, contractions, leaking of fluid and fetal movement were reviewed in detail with the patient. Please refer to After Visit Summary for other counseling recommendations.   Return in about 2 weeks (around 01/18/2021) for 2-2 and half weeks, in person, low risk ob, md or app.  Future Appointments  Date Time Provider Department Center  01/11/2021  8:35 AM [place] Bing, MD [facility]    [place] Bing, MD

## 2021-01-06 NOTE — L&D Delivery Note (Signed)
OB/GYN Faculty Practice Delivery Note  Sherri [name] is a 32 y.o. G2P0101 s/p SVD at [redacted]w[redacted]d. She was admitted for IOL due to [facility] 4/8, but found to be in spontaneous labor upon arrival.   ROM: 0h 24m with moderate meconium fluid GBS Status: Negative/-- (01/23 1138) Maximum Maternal Temperature: 20F  Labor Progress: Initial SVE: 6/90/-2. She then progressed to complete with expectant management    Delivery Date/Time: 2/17 at 1230  Delivery: Called to room and patient was complete and pushing. Head delivered L OA. No nuchal cord present. Shoulder and body delivered in usual fashion. Infant with spontaneous cry, placed on mother's abdomen, dried and stimulated. Cord clamped x 2 after 1-minute delay, and cut by FOB. Cord blood drawn. Placenta delivered spontaneously with gentle cord traction. Fundus firm with massage and Pitocin. Labia, perineum, vagina, and cervix inspected with no lacerations.  Baby Weight: pending  Placenta: 3 vessel, intact. Sent to L&D. Complications: None Lacerations: None EBL: 450 mL Analgesia: Epidural   Infant:  APGAR (1 MIN):  9 APGAR (5 MINS):  9  Leticia Penna, DO  OB Family Medicine Fellow, [facility] for [facility], [facility] 02/22/2021, 2:36 PM

## 2021-01-11 ENCOUNTER — Encounter: Payer: Self-pay | Admitting: Obstetrics and Gynecology

## 2021-01-18 ENCOUNTER — Ambulatory Visit (INDEPENDENT_AMBULATORY_CARE_PROVIDER_SITE_OTHER): Payer: Self-pay | Admitting: Obstetrics and Gynecology

## 2021-01-18 ENCOUNTER — Other Ambulatory Visit: Payer: Self-pay

## 2021-01-18 VITALS — BP 104/72 | HR 92 | Wt 158.6 lb

## 2021-01-18 DIAGNOSIS — Z5941 Food insecurity: Secondary | ICD-10-CM

## 2021-01-18 DIAGNOSIS — Z3A35 35 weeks gestation of pregnancy: Secondary | ICD-10-CM

## 2021-01-18 NOTE — Progress Notes (Signed)
° °  PRENATAL VISIT NOTE  Subjective:  Sherri [name] is a 31 y.o. G2P0101 at [redacted]w[redacted]d being seen today for ongoing prenatal care.  She is currently monitored for the following issues for this low-risk pregnancy and has Supervision of high risk pregnancy, antepartum; History of preterm delivery, currently pregnant; and Language barrier on their problem list.  Patient reports  leg cramps .  Contractions: Irritability. Vag. Bleeding: None.  Movement: Present. Denies leaking of fluid.   The following portions of the patient's history were reviewed and updated as appropriate: allergies, current medications, past family history, past medical history, past social history, past surgical history and problem list.   Objective:   Vitals:   01/18/21 0819  BP: 104/72  Pulse: 92  Weight: 158 lb 9.6 oz (71.9 kg)    Fetal Status: Fetal Heart Rate (bpm): 120 Fundal Height: 35 cm Movement: Present  Presentation: Vertex  General:  Alert, oriented and cooperative. Patient is in no acute distress.  Skin: Skin is warm and dry. No rash noted.   Cardiovascular: Normal heart rate noted  Respiratory: Normal respiratory effort, no problems with respiration noted  Abdomen: Soft, gravid, appropriate for gestational age.  Pain/Pressure: Present     Pelvic: Cervical exam performed in the presence of a chaperone Dilation: 1 Effacement (%): 0 Station: Ballotable  Extremities: Normal range of motion.  Edema: Trace  Mental Status: Normal mood and affect. Normal behavior. Normal judgment and thought content.   Assessment and Plan:  Pregnancy: G2P0101 at [redacted]w[redacted]d GBS nv. PO mg and foods with potassium like potato skins advised to help with the leg cramps  Interpreter used  Preterm labor symptoms and general obstetric precautions including but not limited to vaginal bleeding, contractions, leaking of fluid and fetal movement were reviewed in detail with the patient. Please refer to After Visit Summary for other  counseling recommendations.   Return in about 1 day (around 01/19/2021) for low risk ob, in person, md or app.  No future appointments.  [place] Bing, MD

## 2021-01-25 ENCOUNTER — Encounter (HOSPITAL_COMMUNITY): Payer: Self-pay | Admitting: Obstetrics & Gynecology

## 2021-01-25 ENCOUNTER — Inpatient Hospital Stay (HOSPITAL_COMMUNITY)
Admission: AD | Admit: 2021-01-25 | Discharge: 2021-01-26 | Disposition: A | Discharge status: Home / Self Care | Payer: Self-pay | Attending: Obstetrics & Gynecology | Admitting: Obstetrics & Gynecology

## 2021-01-25 DIAGNOSIS — O26893 Other specified pregnancy related conditions, third trimester: Secondary | ICD-10-CM | POA: Insufficient documentation

## 2021-01-25 DIAGNOSIS — Z0371 Encounter for suspected problem with amniotic cavity and membrane ruled out: Secondary | ICD-10-CM | POA: Insufficient documentation

## 2021-01-25 DIAGNOSIS — R109 Unspecified abdominal pain: Secondary | ICD-10-CM | POA: Insufficient documentation

## 2021-01-25 DIAGNOSIS — Z3A36 36 weeks gestation of pregnancy: Secondary | ICD-10-CM | POA: Insufficient documentation

## 2021-01-25 DIAGNOSIS — O4703 False labor before 37 completed weeks of gestation, third trimester: Secondary | ICD-10-CM | POA: Insufficient documentation

## 2021-01-25 DIAGNOSIS — Z3689 Encounter for other specified antenatal screening: Secondary | ICD-10-CM | POA: Insufficient documentation

## 2021-01-25 DIAGNOSIS — O368130 Decreased fetal movements, third trimester, not applicable or unspecified: Secondary | ICD-10-CM | POA: Insufficient documentation

## 2021-01-25 LAB — POCT FERN TEST: POCT Fern Test: NEGATIVE

## 2021-01-25 NOTE — MAU Note (Addendum)
PT presented to MAU for CTX, possible LOF, and DFM. Pt reports that on Weds she has a gush of leak that caused her clothing to be wet, and it has intermittently continued since of clear and odorless fluid that she can not stop while peeing. Pt states that her CT started today more frequent since 1700. PT did not report to [facility] provider. Pt report pain rating of 110 in ABD and back. Pt states baby is not moving as much as before and can notice DFM. Pt denies VB, abnormal discharge, and PIH s/s. Last intercourse was yesterday. Pt has hx of Preterm at 36.6. Last SVE in office pt reports 1cm.

## 2021-01-25 NOTE — MAU Provider Note (Signed)
History     CSN: [id number]  Arrival date and time: 01/25/21 2218   Event Date/Time   First Provider Initiated Contact with Patient 01/25/21 2325      Chief Complaint  Patient presents with   Abdominal Pain   Decreased Fetal Movement   Contractions   Sherri [name] is a 31 y.o. G2P0101 at [redacted]w[redacted]d who receives care at [facility].  She presents today for Decreased Fetal Movement and LOF. She reports she felt that movement was decreased on Wednesday and tried eating chocolate and drank cold beverages with no improvement.  However, she reports good movement now. She reports that she had  "wetness" that started on Wednesday around noon and wasn't sure if it was leaking or discharge.  She states she has continued to have this wetness and reports last incident was a little bit today at noon.  She denies color and odor.  She reports frequent contractions that started today after 5pm.  She denies vaginal bleeding. Appt scheduled for Monday   OB History     Gravida  2   Para  1   Term      Preterm  1   AB      Living  1      SAB      IAB      Ectopic      Multiple  0   Live Births  1           Past Medical History:  Diagnosis Date   Medical history non-contributory    Preterm labor     Past Surgical History:  Procedure Laterality Date   NO PAST SURGERIES      Family History  Problem Relation Age of Onset   Diabetes Father    Diabetes Paternal Grandmother     Social History   Tobacco Use   Smoking status: Never   Smokeless tobacco: Never  Vaping Use   Vaping Use: Never used  Substance Use Topics   Alcohol use: No   Drug use: No    Allergies:  Allergies  Allergen Reactions   Pork-Derived Products Hives   Penicillins Rash    Has patient had a PCN reaction causing immediate rash, facial/tongue/throat swelling, SOB or lightheadedness with hypotension: No Has patient had a PCN reaction causing severe rash involving mucus membranes or skin  necrosis: No Has patient had a PCN reaction that required hospitalization No Has patient had a PCN reaction occurring within the last 10 years: No If all of the above answers are "NO", then may proceed with Cephalosporin use.     Medications Prior to Admission  Medication Sig Dispense Refill Last Dose   ferrous sulfate (FERROUSUL) 325 (65 FE) MG tablet Take 1 tablet (325 mg total) by mouth every other day. 60 tablet 1 01/25/2021   Prenatal Multivit-Min-Fe-FA (PRE-NATAL FORMULA) TABS Take by mouth.   01/24/2021    Review of Systems  Constitutional:  Negative for chills and fever.  Gastrointestinal:  Positive for abdominal pain (Contractions). Negative for nausea and vomiting.  Genitourinary:  Negative for difficulty urinating, dysuria and vaginal bleeding.  Neurological:  Negative for dizziness, light-headedness and headaches.  Physical Exam   Blood pressure 118/78, pulse 91, temperature 98.3 F (36.8 C), temperature source Oral, resp. rate 18, last menstrual period 05/13/2020, SpO2 100 %.  Physical Exam Constitutional:      Appearance: She is well-developed.  HENT:     Head: Normocephalic and atraumatic.  Pulmonary:  Effort: Pulmonary effort is normal. No respiratory distress.     Breath sounds: Normal breath sounds.  Abdominal:     Comments: Gravid, Appears AGA  Neurological:     Mental Status: She is alert and oriented to person, place, and time.  Psychiatric:        Mood and Affect: Mood normal.        Behavior: Behavior normal.    Fetal Assessment 145 bpm, Mod Var, -Decels, +Accels Toco: Q1-2 min  MAU Course   Results for orders placed or performed during the hospital encounter of 01/25/21 (from the past 24 hour(s))  POCT fern test     Status: Normal   Collection Time: 01/25/21 11:05 PM  Result Value Ref Range   POCT Fern Test Negative = intact amniotic membranes   Amnisure rupture of membrane (rom)not at [facility]     Status: None   Collection Time: 01/25/21 11:41  PM  Result Value Ref Range   Amnisure ROM NEGATIVE    No results found.  MDM PE Labs: Amnisure EFM Assessment and Plan  31 year old G2P0101  SIUP at 36.5 weeks Cat I FT DFM R/O ROM Language Barrier   -Amnisure collected by nurse. Crist Fat Negative. -Will await results. -Good fetal movement tracked and reported. -NST Reactive. -Will continue to monitor.  -Interpretations completed with assistance of video interpreter: [facility] [phone].   Cherre Robins MSN, CNM 01/25/2021, 11:26 PM   Reassessment (12:27 AM)  -Amnisure returns negative. -Provider to bedside to discuss. -Instructed to keep appt as scheduled for Monday -Nurse instructed to perform cervical exam and reports no change. -Precautions given. -Discharged to home in stable condition.  Cherre Robins MSN, CNM Advanced Practice Provider, Center for [employer]

## 2021-01-26 DIAGNOSIS — Z0371 Encounter for suspected problem with amniotic cavity and membrane ruled out: Secondary | ICD-10-CM

## 2021-01-26 DIAGNOSIS — Z3A36 36 weeks gestation of pregnancy: Secondary | ICD-10-CM

## 2021-01-26 DIAGNOSIS — Z3689 Encounter for other specified antenatal screening: Secondary | ICD-10-CM

## 2021-01-26 LAB — AMNISURE RUPTURE OF MEMBRANE (ROM) NOT AT ARMC: Amnisure ROM: NEGATIVE

## 2021-01-28 ENCOUNTER — Other Ambulatory Visit (HOSPITAL_COMMUNITY)
Admission: RE | Admit: 2021-01-28 | Discharge: 2021-01-28 | Disposition: A | Discharge status: Home / Self Care | Payer: Self-pay | Source: Ambulatory Visit | Attending: Family Medicine | Admitting: Family Medicine

## 2021-01-28 ENCOUNTER — Other Ambulatory Visit: Payer: Self-pay

## 2021-01-28 ENCOUNTER — Ambulatory Visit (INDEPENDENT_AMBULATORY_CARE_PROVIDER_SITE_OTHER): Payer: Self-pay | Admitting: Family Medicine

## 2021-01-28 ENCOUNTER — Encounter: Payer: Self-pay | Admitting: Family Medicine

## 2021-01-28 VITALS — BP 99/71 | HR 88 | Wt 162.8 lb

## 2021-01-28 DIAGNOSIS — O0990 Supervision of high risk pregnancy, unspecified, unspecified trimester: Secondary | ICD-10-CM

## 2021-01-28 DIAGNOSIS — Z3A37 37 weeks gestation of pregnancy: Secondary | ICD-10-CM

## 2021-01-28 DIAGNOSIS — O0993 Supervision of high risk pregnancy, unspecified, third trimester: Secondary | ICD-10-CM | POA: Insufficient documentation

## 2021-01-28 DIAGNOSIS — O09899 Supervision of other high risk pregnancies, unspecified trimester: Secondary | ICD-10-CM

## 2021-01-28 DIAGNOSIS — Z789 Other specified health status: Secondary | ICD-10-CM

## 2021-01-28 DIAGNOSIS — Z758 Other problems related to medical facilities and other health care: Secondary | ICD-10-CM

## 2021-01-28 DIAGNOSIS — O368130 Decreased fetal movements, third trimester, not applicable or unspecified: Secondary | ICD-10-CM

## 2021-01-28 NOTE — Progress Notes (Signed)
° ° °  Subjective:  Sherri [name] is a 31 y.o. G2P0101 at [redacted]w[redacted]d being seen today for ongoing prenatal care.  She is currently monitored for the following issues for this high-risk pregnancy and has Supervision of high risk pregnancy, antepartum; History of preterm delivery, currently pregnant; and Language barrier on their problem list.  Patient reports  decreased fetal movement . Seen for the same concern in the MAU on 1/20 with a reactive NST. She has felt less intermittently since about 1/19, but still feeling movement. Reports "it is less than it was 1-2 months ago". Contractions: Irritability. Vag. Bleeding: None.  Movement: (!) Decreased. Denies leaking of fluid.   The following portions of the patient's history were reviewed and updated as appropriate: allergies, current medications, past family history, past medical history, past social history, past surgical history and problem list.   Objective:   Vitals:   01/28/21 1051  BP: 99/71  Pulse: 88  Weight: 162 lb 12.8 oz (73.8 kg)    Fetal Status: Fetal Heart Rate (bpm): 146   Movement: (!) Decreased     General:  Alert, oriented and cooperative. Patient is in no acute distress.  Skin: Skin is warm and dry. No rash noted.   Cardiovascular: Normal heart rate noted  Respiratory: Normal respiratory effort, no problems with respiration noted  Abdomen: Soft, gravid, appropriate for gestational age. Pain/Pressure: Present     Pelvic:   NEFG        gc/ch/GBS swabs collected with chaperone.   Extremities: Normal range of motion.     Mental Status: Normal mood and affect. Normal behavior. Normal judgment and thought content.    Assessment and Plan:  Pregnancy: G2P0101 at [redacted]w[redacted]d  1. Supervision of high risk pregnancy, antepartum Doing well, however with DFM as below.  - AMBULATORY REFERRAL TO [facility]  2. Decreased fetal movement Intermittently present since 1/19. Reactive NST in MAU on 1/20 with reactive NST today. Her  movement returned to normal during NST/having some juice today. Set expectation of movement in the third trimester and educated on kick counts at home. If recurrent, should have a BPP. MAU precautions strictly discussed.   3. [redacted] weeks gestation of pregnancy - Strep Gp B Culture+Rflx - Cervicovaginal ancillary only( [place])  4. History of preterm delivery, currently pregnant No current s/sx.   5. Language barrier Spanish in person interpreter used.   Term labor symptoms and general obstetric precautions including but not limited to vaginal bleeding, contractions, leaking of fluid and fetal movement were reviewed in detail with the patient. Please refer to After Visit Summary for other counseling recommendations.   Return in about 1 week (around 02/04/2021) for [place].   Patriciaann Clan, DO

## 2021-01-29 LAB — CERVICOVAGINAL ANCILLARY ONLY
Chlamydia: NEGATIVE
Comment: NEGATIVE
Comment: NORMAL
Neisseria Gonorrhea: NEGATIVE

## 2021-02-01 LAB — STREP GP B CULTURE+RFLX: Strep Gp B Culture+Rflx: NEGATIVE

## 2021-02-04 ENCOUNTER — Encounter: Payer: Self-pay | Admitting: *Deleted

## 2021-02-04 ENCOUNTER — Ambulatory Visit (INDEPENDENT_AMBULATORY_CARE_PROVIDER_SITE_OTHER): Payer: Self-pay | Admitting: Obstetrics & Gynecology

## 2021-02-04 ENCOUNTER — Other Ambulatory Visit: Payer: Self-pay

## 2021-02-04 VITALS — BP 106/73 | HR 96 | Wt 164.3 lb

## 2021-02-04 DIAGNOSIS — O09899 Supervision of other high risk pregnancies, unspecified trimester: Secondary | ICD-10-CM

## 2021-02-04 DIAGNOSIS — O0990 Supervision of high risk pregnancy, unspecified, unspecified trimester: Secondary | ICD-10-CM

## 2021-02-04 NOTE — Progress Notes (Addendum)
°  Subjective:    Sherri [name] is a 31 y.o. female being seen today for her obstetrical visit. She is at [redacted]w[redacted]d gestation. Patient reports  increased pelvic pressure and vaginal pain with positional changes from lying to standing and sitting to standing. Sometimes makes walking difficult. Patient reports a few contractions . Fetal movement: normal.  Review of Systems:   Review of Systems  Genitourinary:  Positive for vaginal pain. Negative for vaginal bleeding and vaginal discharge.  Musculoskeletal:  Positive for gait problem.    Objective:    BP 106/73    Pulse 96    Wt 164 lb 4.8 oz (74.5 kg)    LMP 05/13/2020    BMI 32.09 kg/m   Physical Exam Vitals and nursing note reviewed.  HENT:     Head: Normocephalic and atraumatic.  Pulmonary:     Effort: Pulmonary effort is normal.  Abdominal:     Tenderness: There is no abdominal tenderness.  Skin:    General: Skin is warm and dry.  Neurological:     Mental Status: She is alert.  Psychiatric:        Mood and Affect: Mood normal.    Maternal Exam:  Abdomen: Patient reports no abdominal tenderness. Fundal height is 35cm.     Assessment:    Pregnancy:  G2P0101 [redacted]w[redacted]d   Sherri [name] is a 31yo G2P1 at [redacted]w[redacted]d doing well. Patient was reassured that increased vaginal pressure is normal.  Plan:    Pregnancy:  G2P0101 [redacted]w[redacted]d  Supervision of high-risk pregnancy and history of pre-term delivery - Continue routine prenatal care - Continue prenatal vitamins - Cervical check at next visit - Patient informed about return precautions including vaginal bleeding, contractions, and leaking of fluid given history of preterm delivery  2.    Food Insecurity - Patient taken to food market   Follow up in 1 Week.    Danie Chandler, [place] 02/04/21  Attestation of Attending Supervision of Medical Student: Evaluation and management procedures were performed by the medical student under my supervision and collaboration.  I have  reviewed the student's note and chart, and I agree with the management and plan.  Emeterio Reeve, MD, [place] Attending [place], [facility]

## 2021-02-11 ENCOUNTER — Telehealth (HOSPITAL_COMMUNITY): Payer: Self-pay | Admitting: *Deleted

## 2021-02-11 ENCOUNTER — Other Ambulatory Visit: Payer: Self-pay

## 2021-02-11 ENCOUNTER — Ambulatory Visit (INDEPENDENT_AMBULATORY_CARE_PROVIDER_SITE_OTHER): Payer: Self-pay | Admitting: Family Medicine

## 2021-02-11 ENCOUNTER — Encounter: Payer: Self-pay | Admitting: Family Medicine

## 2021-02-11 VITALS — BP 115/74 | HR 91 | Wt 164.3 lb

## 2021-02-11 DIAGNOSIS — Z789 Other specified health status: Secondary | ICD-10-CM

## 2021-02-11 DIAGNOSIS — O09899 Supervision of other high risk pregnancies, unspecified trimester: Secondary | ICD-10-CM

## 2021-02-11 DIAGNOSIS — Z758 Other problems related to medical facilities and other health care: Secondary | ICD-10-CM

## 2021-02-11 DIAGNOSIS — Z3A39 39 weeks gestation of pregnancy: Secondary | ICD-10-CM

## 2021-02-11 DIAGNOSIS — O0990 Supervision of high risk pregnancy, unspecified, unspecified trimester: Secondary | ICD-10-CM

## 2021-02-11 NOTE — Telephone Encounter (Signed)
Preadmission screen Interpreter number [phone]

## 2021-02-11 NOTE — Progress Notes (Signed)
° ° °  Subjective:  Sherri [name] is a 31 y.o. G2P0101 at [redacted]w[redacted]d being seen today for ongoing prenatal care.  She is currently monitored for the following issues for this high-risk pregnancy and has Supervision of high risk pregnancy, antepartum; History of preterm delivery, currently pregnant; and Language barrier on their problem list.  Patient reports no complaints.  Contractions: Irritability. Vag. Bleeding: None.  Movement: Present. Denies leaking of fluid.   The following portions of the patient's history were reviewed and updated as appropriate: allergies, current medications, past family history, past medical history, past social history, past surgical history and problem list.   Objective:   Vitals:   02/11/21 0839  BP: 115/74  Pulse: 91  Weight: 164 lb 4.8 oz (74.5 kg)    Fetal Status: Fetal Heart Rate (bpm): 154 Fundal Height: 38 cm Movement: Present  Presentation: Vertex  General:  Alert, oriented and cooperative. Patient is in no acute distress.  Skin: Skin is warm and dry. No rash noted.   Cardiovascular: Normal heart rate noted  Respiratory: Normal respiratory effort, no problems with respiration noted  Abdomen: Soft, gravid, appropriate for gestational age. Pain/Pressure: Present     Pelvic:  Cervical exam performed in the presence of a chaperone Dilation: 2 Effacement (%): 70 Station: -3  Extremities: Normal range of motion.  Edema: Trace  Mental Status: Normal mood and affect. Normal behavior. Normal judgment and thought content.    Assessment and Plan:  Pregnancy: G2P0101 at [redacted]w[redacted]d  1. Supervision of high risk pregnancy, antepartum Doing well with normal movement.   2. [redacted] weeks gestation of pregnancy Cervix favorable. Scheduled 41 week IOL today if does not go into spontaneous labor prior to.   3. History of preterm delivery, currently pregnant Now at term.   4. Language barrier Spanish interpreter used.   Term labor symptoms and general obstetric  precautions including but not limited to vaginal bleeding, contractions, leaking of fluid and fetal movement were reviewed in detail with the patient. Please refer to After Visit Summary for other counseling recommendations.   Return in about 1 week (around 02/18/2021) for [place].   Patriciaann Clan, DO

## 2021-02-12 ENCOUNTER — Other Ambulatory Visit: Payer: Self-pay | Admitting: Family Medicine

## 2021-02-15 ENCOUNTER — Telehealth (HOSPITAL_COMMUNITY): Payer: Self-pay | Admitting: *Deleted

## 2021-02-15 ENCOUNTER — Encounter (HOSPITAL_COMMUNITY): Payer: Self-pay | Admitting: *Deleted

## 2021-02-15 NOTE — Telephone Encounter (Signed)
Preadmission screen Interpreter number [phone]

## 2021-02-18 ENCOUNTER — Other Ambulatory Visit: Payer: Self-pay

## 2021-02-18 ENCOUNTER — Telehealth: Payer: Self-pay

## 2021-02-18 ENCOUNTER — Ambulatory Visit (INDEPENDENT_AMBULATORY_CARE_PROVIDER_SITE_OTHER): Payer: Self-pay | Admitting: Obstetrics and Gynecology

## 2021-02-18 VITALS — BP 111/77 | HR 89 | Wt 166.0 lb

## 2021-02-18 DIAGNOSIS — O0990 Supervision of high risk pregnancy, unspecified, unspecified trimester: Secondary | ICD-10-CM

## 2021-02-18 DIAGNOSIS — Z789 Other specified health status: Secondary | ICD-10-CM

## 2021-02-18 DIAGNOSIS — Z3A40 40 weeks gestation of pregnancy: Secondary | ICD-10-CM

## 2021-02-18 NOTE — Telephone Encounter (Signed)
Called Pt using [place] [employer] Interpreter Annabell id# [phone] to advise that she has an appointment with MFM for her BPP on 02/22/21 at 7:15a on the 2nd floor, no answer, left VM.

## 2021-02-20 NOTE — Progress Notes (Signed)
° °  PRENATAL VISIT NOTE  Subjective:  Sherri [name] is a 31 y.o. G2P0101 at [redacted]w[redacted]d being seen today for ongoing prenatal care.  She is currently monitored for the following issues for this low-risk pregnancy and has Supervision of high risk pregnancy, antepartum; History of preterm delivery, currently pregnant; and Language barrier on their problem list.  Patient reports no complaints.  Contractions: Irritability. Vag. Bleeding: None.  Movement: Present. Denies leaking of fluid.   The following portions of the patient's history were reviewed and updated as appropriate: allergies, current medications, past family history, past medical history, past social history, past surgical history and problem list.   Objective:   Vitals:   02/18/21 1419  BP: 111/77  Pulse: 89  Weight: 166 lb (75.3 kg)    Fetal Status: Fetal Heart Rate (bpm): 132 Fundal Height: 39 cm Movement: Present  Presentation: Vertex  General:  Alert, oriented and cooperative. Patient is in no acute distress.  Skin: Skin is warm and dry. No rash noted.   Cardiovascular: Normal heart rate noted  Respiratory: Normal respiratory effort, no problems with respiration noted  Abdomen: Soft, gravid, appropriate for gestational age.  Pain/Pressure: Present     Pelvic: Cervical exam performed in the presence of a chaperone Dilation: 3 Effacement (%): 70 Station: -2  Extremities: Normal range of motion.  Edema: Trace  Mental Status: Normal mood and affect. Normal behavior. Normal judgment and thought content.   Assessment and Plan:  Pregnancy: G2P0101 at [redacted]w[redacted]d 1. Supervision of high risk pregnancy, antepartum Routine care.  - US Fetal BPP W/O Non Stress; Future  2. [redacted] weeks gestation of pregnancy Pt already set up for IOL Will bring back later this week for post dates testing  3. Language barrier Interpreter used  Term labor symptoms and general obstetric precautions including but not limited to vaginal bleeding,  contractions, leaking of fluid and fetal movement were reviewed in detail with the patient. Please refer to After Visit Summary for other counseling recommendations.   Return in about 3 days (around 02/21/2021) for nst/bpp with diane or carrie visit only.  Future Appointments  Date Time Provider Department Center  02/22/2021  7:30 AM [facility] US2 [facility]  02/24/2021  7:30 AM [facility] SCHED ROOM [facility] None    Hawk Run Bing, MD

## 2021-02-21 ENCOUNTER — Other Ambulatory Visit: Payer: Self-pay | Admitting: Obstetrics & Gynecology

## 2021-02-21 DIAGNOSIS — O0990 Supervision of high risk pregnancy, unspecified, unspecified trimester: Secondary | ICD-10-CM

## 2021-02-22 ENCOUNTER — Inpatient Hospital Stay (HOSPITAL_COMMUNITY): Payer: Medicaid Other | Admitting: Anesthesiology

## 2021-02-22 ENCOUNTER — Encounter (HOSPITAL_COMMUNITY): Payer: Self-pay | Admitting: Obstetrics and Gynecology

## 2021-02-22 ENCOUNTER — Inpatient Hospital Stay (HOSPITAL_COMMUNITY)
Admission: AD | Admit: 2021-02-22 | Discharge: 2021-02-24 | DRG: 807 | Disposition: A | Discharge status: Home / Self Care | Payer: Medicaid Other | Attending: Obstetrics and Gynecology | Admitting: Obstetrics and Gynecology

## 2021-02-22 ENCOUNTER — Other Ambulatory Visit: Payer: Self-pay | Admitting: Obstetrics & Gynecology

## 2021-02-22 ENCOUNTER — Other Ambulatory Visit: Payer: Self-pay

## 2021-02-22 ENCOUNTER — Ambulatory Visit: Payer: Medicaid Other | Attending: Obstetrics and Gynecology

## 2021-02-22 DIAGNOSIS — Z3A40 40 weeks gestation of pregnancy: Secondary | ICD-10-CM

## 2021-02-22 DIAGNOSIS — O480 Post-term pregnancy: Secondary | ICD-10-CM | POA: Diagnosis present

## 2021-02-22 DIAGNOSIS — Z880 Allergy status to penicillin: Secondary | ICD-10-CM | POA: Diagnosis not present

## 2021-02-22 DIAGNOSIS — Z758 Other problems related to medical facilities and other health care: Secondary | ICD-10-CM | POA: Diagnosis present

## 2021-02-22 DIAGNOSIS — Z789 Other specified health status: Secondary | ICD-10-CM | POA: Diagnosis present

## 2021-02-22 DIAGNOSIS — O0990 Supervision of high risk pregnancy, unspecified, unspecified trimester: Secondary | ICD-10-CM | POA: Diagnosis present

## 2021-02-22 DIAGNOSIS — Z20822 Contact with and (suspected) exposure to covid-19: Secondary | ICD-10-CM | POA: Diagnosis present

## 2021-02-22 DIAGNOSIS — O09899 Supervision of other high risk pregnancies, unspecified trimester: Secondary | ICD-10-CM

## 2021-02-22 DIAGNOSIS — O09213 Supervision of pregnancy with history of pre-term labor, third trimester: Secondary | ICD-10-CM

## 2021-02-22 LAB — CBC
HCT: 37 % (ref 36.0–46.0)
Hemoglobin: 11.9 g/dL — ABNORMAL LOW (ref 12.0–15.0)
MCH: 26.1 pg (ref 26.0–34.0)
MCHC: 32.2 g/dL (ref 30.0–36.0)
MCV: 81.1 fL (ref 80.0–100.0)
Platelets: 259 10*3/uL (ref 150–400)
RBC: 4.56 MIL/uL (ref 3.87–5.11)
RDW: 17.6 % — ABNORMAL HIGH (ref 11.5–15.5)
WBC: 10.4 10*3/uL (ref 4.0–10.5)
nRBC: 0 % (ref 0.0–0.2)

## 2021-02-22 LAB — RESP PANEL BY RT-PCR (FLU A&B, COVID) ARPGX2
Influenza A by PCR: NEGATIVE
Influenza B by PCR: NEGATIVE
SARS Coronavirus 2 by RT PCR: NEGATIVE

## 2021-02-22 LAB — TYPE AND SCREEN
ABO/RH(D): O POS
Antibody Screen: NEGATIVE

## 2021-02-22 LAB — RPR: RPR Ser Ql: NONREACTIVE

## 2021-02-22 MED ORDER — SOD CITRATE-CITRIC ACID 500-334 MG/5ML PO SOLN: 30.0000 mL | ORAL | Status: DC | PRN

## 2021-02-22 MED ORDER — COCONUT OIL OIL: 1.0000 "application " | TOPICAL_OIL | Status: DC | PRN

## 2021-02-22 MED ORDER — LACTATED RINGERS IV SOLN: INTRAVENOUS | Status: DC

## 2021-02-22 MED ORDER — BENZOCAINE-MENTHOL 20-0.5 % EX AERO: 1.0000 "application " | INHALATION_SPRAY | CUTANEOUS | Status: DC | PRN

## 2021-02-22 MED ORDER — LACTATED RINGERS IV SOLN: 500.0000 mL | Freq: Once | INTRAVENOUS | Status: DC

## 2021-02-22 MED ORDER — WITCH HAZEL-GLYCERIN EX PADS: 1.0000 "application " | MEDICATED_PAD | CUTANEOUS | Status: DC | PRN

## 2021-02-22 MED ORDER — SIMETHICONE 80 MG PO CHEW
80.0000 mg | CHEWABLE_TABLET | ORAL | Status: DC | PRN
  Filled 2021-02-22: qty 1

## 2021-02-22 MED ORDER — OXYCODONE-ACETAMINOPHEN 5-325 MG PO TABS: 2.0000 | ORAL_TABLET | ORAL | Status: DC | PRN

## 2021-02-22 MED ORDER — TETANUS-DIPHTH-ACELL PERTUSSIS 5-2.5-18.5 LF-MCG/0.5 IM SUSY: 0.5000 mL | PREFILLED_SYRINGE | Freq: Once | INTRAMUSCULAR | Status: DC

## 2021-02-22 MED ORDER — EPHEDRINE 5 MG/ML INJ: 10.0000 mg | INTRAVENOUS | Status: DC | PRN

## 2021-02-22 MED ORDER — IBUPROFEN 600 MG PO TABS
600.0000 mg | ORAL_TABLET | Freq: Four times a day (QID) | ORAL | Status: DC
  Administered 2021-02-22 – 2021-02-24 (×8): 600 mg via ORAL
  Filled 2021-02-22 (×8): qty 1

## 2021-02-22 MED ORDER — FENTANYL-BUPIVACAINE-NACL 0.5-0.125-0.9 MG/250ML-% EP SOLN: 12.0000 mL/h | EPIDURAL | Status: DC | PRN

## 2021-02-22 MED ORDER — SENNOSIDES-DOCUSATE SODIUM 8.6-50 MG PO TABS
2.0000 | ORAL_TABLET | ORAL | Status: DC
  Administered 2021-02-22 – 2021-02-23 (×2): 2 via ORAL
  Filled 2021-02-22 (×2): qty 2

## 2021-02-22 MED ORDER — PHENYLEPHRINE 40 MCG/ML (10ML) SYRINGE FOR IV PUSH (FOR BLOOD PRESSURE SUPPORT)
80.0000 ug | PREFILLED_SYRINGE | INTRAVENOUS | Status: DC | PRN
  Filled 2021-02-22: qty 10

## 2021-02-22 MED ORDER — ONDANSETRON HCL 4 MG/2ML IJ SOLN: 4.0000 mg | INTRAMUSCULAR | Status: DC | PRN

## 2021-02-22 MED ORDER — ONDANSETRON HCL 4 MG PO TABS: 4.0000 mg | ORAL_TABLET | ORAL | Status: DC | PRN

## 2021-02-22 MED ORDER — OXYTOCIN BOLUS FROM INFUSION
333.0000 mL | Freq: Once | INTRAVENOUS | Status: AC
  Administered 2021-02-22: 333 mL via INTRAVENOUS

## 2021-02-22 MED ORDER — DIPHENHYDRAMINE HCL 50 MG/ML IJ SOLN: 12.5000 mg | INTRAMUSCULAR | Status: DC | PRN

## 2021-02-22 MED ORDER — SODIUM CHLORIDE 0.9% FLUSH: 3.0000 mL | Freq: Two times a day (BID) | INTRAVENOUS | Status: DC

## 2021-02-22 MED ORDER — OXYTOCIN-SODIUM CHLORIDE 30-0.9 UT/500ML-% IV SOLN
2.5000 [IU]/h | INTRAVENOUS | Status: DC
  Administered 2021-02-22: 2.5 [IU]/h via INTRAVENOUS
  Filled 2021-02-22: qty 500

## 2021-02-22 MED ORDER — FENTANYL CITRATE (PF) 100 MCG/2ML IJ SOLN
50.0000 ug | INTRAMUSCULAR | Status: DC | PRN
  Administered 2021-02-22: 100 ug via INTRAVENOUS
  Filled 2021-02-22: qty 2

## 2021-02-22 MED ORDER — ZOLPIDEM TARTRATE 5 MG PO TABS: 5.0000 mg | ORAL_TABLET | Freq: Every evening | ORAL | Status: DC | PRN

## 2021-02-22 MED ORDER — PRENATAL MULTIVITAMIN CH
1.0000 | ORAL_TABLET | Freq: Every day | ORAL | Status: DC
  Administered 2021-02-22 – 2021-02-24 (×3): 1 via ORAL
  Filled 2021-02-22 (×3): qty 1

## 2021-02-22 MED ORDER — FENTANYL-BUPIVACAINE-NACL 0.5-0.125-0.9 MG/250ML-% EP SOLN
12.0000 mL/h | EPIDURAL | Status: DC | PRN
  Administered 2021-02-22: 12 mL/h via EPIDURAL
  Filled 2021-02-22: qty 250

## 2021-02-22 MED ORDER — PHENYLEPHRINE 40 MCG/ML (10ML) SYRINGE FOR IV PUSH (FOR BLOOD PRESSURE SUPPORT): 80.0000 ug | PREFILLED_SYRINGE | INTRAVENOUS | Status: DC | PRN

## 2021-02-22 MED ORDER — SODIUM CHLORIDE 0.9% FLUSH: 3.0000 mL | INTRAVENOUS | Status: DC | PRN

## 2021-02-22 MED ORDER — ACETAMINOPHEN 325 MG PO TABS
650.0000 mg | ORAL_TABLET | ORAL | Status: DC | PRN
  Filled 2021-02-22: qty 2

## 2021-02-22 MED ORDER — ONDANSETRON HCL 4 MG/2ML IJ SOLN: 4.0000 mg | Freq: Four times a day (QID) | INTRAMUSCULAR | Status: DC | PRN

## 2021-02-22 MED ORDER — LIDOCAINE HCL (PF) 1 % IJ SOLN: 30.0000 mL | INTRAMUSCULAR | Status: DC | PRN

## 2021-02-22 MED ORDER — OXYCODONE-ACETAMINOPHEN 5-325 MG PO TABS: 1.0000 | ORAL_TABLET | ORAL | Status: DC | PRN

## 2021-02-22 MED ORDER — ACETAMINOPHEN 325 MG PO TABS: 650.0000 mg | ORAL_TABLET | ORAL | Status: DC | PRN

## 2021-02-22 MED ORDER — LIDOCAINE HCL (PF) 1 % IJ SOLN
INTRAMUSCULAR | Status: DC | PRN
  Administered 2021-02-22: 8 mL via EPIDURAL

## 2021-02-22 MED ORDER — SODIUM CHLORIDE 0.9 % IV SOLN: 250.0000 mL | INTRAVENOUS | Status: DC | PRN

## 2021-02-22 MED ORDER — LACTATED RINGERS IV SOLN: 500.0000 mL | INTRAVENOUS | Status: DC | PRN

## 2021-02-22 MED ORDER — DIPHENHYDRAMINE HCL 25 MG PO CAPS: 25.0000 mg | ORAL_CAPSULE | Freq: Four times a day (QID) | ORAL | Status: DC | PRN

## 2021-02-22 MED ORDER — DIBUCAINE (PERIANAL) 1 % EX OINT: 1.0000 "application " | TOPICAL_OINTMENT | CUTANEOUS | Status: DC | PRN

## 2021-02-22 NOTE — Discharge Summary (Signed)
Postpartum Discharge Summary  Date of Service updated     Patient Name: Sherri [name] DOB: 05-Nov-1990 MRN: [id number]  Date of admission: 02/22/2021 Delivery date:02/22/2021  Delivering provider: Patriciaann Clan  Date of discharge: 02/24/2021  Admitting diagnosis: Post-dates pregnancy [O48.0] Intrauterine pregnancy: [redacted]w[redacted]d    Secondary diagnosis:  Principal Problem:   Post-dates pregnancy Active Problems:   History of preterm delivery, currently pregnant   Language barrier  Additional problems: None    Discharge diagnosis: Term Pregnancy Delivered                                              Post partum procedures: None Augmentation: N/A Complications: None  Hospital course: Onset of Labor With Vaginal Delivery      31y.o. yo [id number] admitted in Active Labor on 02/22/2021. Patient had an uncomplicated labor course as follows:  Membrane Rupture Time/Date: 12:25 PM ,02/22/2021   Delivery Method:Vaginal, Spontaneous  Episiotomy: None  Lacerations:    Patient had an uncomplicated postpartum course.  She is ambulating, tolerating a regular diet, passing flatus, and urinating well. Patient is discharged home in stable condition on 02/24/21.  Newborn Data: Birth date:02/22/2021  Birth time:12:30 PM  Gender:Female  Living status:Living  Apgars:9 ,9  Weight:3374 g   Magnesium Sulfate received: No BMZ received: No Rhophylac:No MMR:No T-DaP:Given prenatally Flu: Yes Transfusion:No  Physical exam  Vitals:   02/23/21 0506 02/23/21 1406 02/23/21 1947 02/24/21 0507  BP: 105/79 105/82 110/81 93/63  Pulse: 79 (!) 102 95 90  Resp: _0 Temp: 98.1 F (36.7 C) 98.7 F (37.1 C) 98.4 F (36.9 C) 98.4 F (36.9 C)  TempSrc: Oral Oral Oral Oral  SpO2: 100% 100% 100% 99%  Weight:      Height:       General: alert, cooperative, and no distress Lochia: appropriate Uterine Fundus: firm Incision: N/A DVT Evaluation: No evidence of DVT seen on  physical exam. Labs: Lab Results  Component Value Date   WBC 10.4 02/22/2021   HGB 11.9 (L) 02/22/2021   HCT 37.0 02/22/2021   MCV 81.1 02/22/2021   PLT 259 02/22/2021   No flowsheet data found. Edinburgh Score: Edinburgh Postnatal Depression Scale Screening Tool 02/22/2021  I have been able to laugh and see the funny side of things. 0  I have looked forward with enjoyment to things. 0  I have blamed myself unnecessarily when things went wrong. 0  I have been anxious or worried for no good reason. 0  I have felt scared or panicky for no good reason. 1  Things have been getting on top of me. 0  I have been so unhappy that I have had difficulty sleeping. 0  I have felt sad or miserable. 0  I have been so unhappy that I have been crying. 1  The thought of harming myself has occurred to me. 0  Edinburgh Postnatal Depression Scale Total 2     After visit meds:  Allergies as of 02/24/2021       Reactions   Pork-derived Products Hives   Penicillins Rash   Has patient had a PCN reaction causing immediate rash, facial/tongue/throat swelling, SOB or lightheadedness with hypotension: No Has patient had a PCN reaction causing severe rash involving mucus membranes or skin necrosis: No Has patient had a  PCN reaction that required hospitalization No Has patient had a PCN reaction occurring within the last 10 years: No If all of the above answers are "NO", then may proceed with Cephalosporin use.        Medication List     STOP taking these medications    ferrous sulfate 325 (65 FE) MG tablet Commonly known as: FerrouSul   Pre-Natal Formula Tabs         Discharge home in stable condition Infant Feeding: Bottle and Breast Infant Disposition:home with mother Discharge instruction: per After Visit Summary and Postpartum booklet. Activity: Advance as tolerated. Pelvic rest for 6 weeks.  Diet: routine diet Future Appointments:No future appointments. Follow up  Visit:  Message sent to [facility] by Dr Higinio Plan  Please schedule this patient for a In person postpartum visit in 6 weeks with the following provider: Any provider. Additional Postpartum F/U: None   Low risk pregnancy complicated by:  history of late pre-term labor  Delivery mode:  Vaginal, Spontaneous  Anticipated Birth Control:   considering pills   02/24/2021 Starr Lake, CNM

## 2021-02-22 NOTE — Anesthesia Preprocedure Evaluation (Signed)
Anesthesia Evaluation  Patient identified by MRN, date of birth, ID band Patient awake    Reviewed: Allergy & Precautions, H&P , NPO status , Patient's Chart, lab work & pertinent test results, reviewed documented beta blocker date and time   Airway Mallampati: III  TM Distance: >3 FB Neck ROM: full    Dental no notable dental hx. (+) Dental Advisory Given, Teeth Intact   Pulmonary neg pulmonary ROS,    Pulmonary exam normal breath sounds clear to auscultation       Cardiovascular negative cardio ROS Normal cardiovascular exam Rhythm:regular Rate:Normal     Neuro/Psych negative neurological ROS  negative psych ROS   GI/Hepatic negative GI ROS, Neg liver ROS,   Endo/Other  negative endocrine ROS  Renal/GU negative Renal ROS  negative genitourinary   Musculoskeletal   Abdominal   Peds  Hematology negative hematology ROS (+)   Anesthesia Other Findings   Reproductive/Obstetrics (+) Pregnancy                             Anesthesia Physical Anesthesia Plan  ASA: 2  Anesthesia Plan: Epidural   Post-op Pain Management:    Induction:   PONV Risk Score and Plan:   Airway Management Planned: Natural Airway  Additional Equipment: None  Intra-op Plan:   Post-operative Plan:   Informed Consent: I have reviewed the patients History and Physical, chart, labs and discussed the procedure including the risks, benefits and alternatives for the proposed anesthesia with the patient or authorized representative who has indicated his/her understanding and acceptance.     Dental Advisory Given  Plan Discussed with:   Anesthesia Plan Comments: (Labs checked- platelets confirmed with RN in room. Fetal heart tracing, per RN, reported to be stable enough for sitting procedure. Discussed epidural, and patient consents to the procedure:  included risk of possible headache,backache, failed block,  allergic reaction, and nerve injury. This patient was asked if she had any questions or concerns before the procedure started.)        Anesthesia Quick Evaluation

## 2021-02-22 NOTE — Anesthesia Postprocedure Evaluation (Signed)
Anesthesia Post Note  Patient: Sherri [name]  Procedure(s) Performed: AN AD HOC LABOR EPIDURAL     Patient location during evaluation: Mother Baby Anesthesia Type: Epidural Level of consciousness: awake and alert Pain management: pain level controlled Vital Signs Assessment: post-procedure vital signs reviewed and stable Respiratory status: spontaneous breathing, nonlabored ventilation and respiratory function stable Cardiovascular status: stable Postop Assessment: no headache, no backache and epidural receding Anesthetic complications: no   No notable events documented.  Last Vitals:  Vitals:   02/22/21 1410 02/22/21 1530  BP: (!) 102/59 107/71  Pulse: 78 98  Resp: 16 17  Temp: 36.7 C 36.7 C    Last Pain:  Vitals:   02/22/21 1530  TempSrc: Oral  PainSc: 0-No pain   Pain Goal:                   [name]

## 2021-02-22 NOTE — Anesthesia Procedure Notes (Addendum)
Epidural Patient location during procedure: OB Start time: 02/22/2021 10:26 AM End time: 02/22/2021 10:30 AM  Staffing Anesthesiologist: Bethena Midget, MD  Preanesthetic Checklist Completed: patient identified, IV checked, site marked, risks and benefits discussed, surgical consent, monitors and equipment checked, pre-op evaluation and timeout performed  Epidural Patient position: sitting Prep: DuraPrep and site prepped and draped Patient monitoring: continuous pulse ox and blood pressure Approach: midline Location: L3-L4 Injection technique: LOR air  Needle:  Needle type: Tuohy  Needle gauge: 17 G Needle length: 9 cm and 9 Needle insertion depth: 6 cm Catheter type: closed end flexible Catheter size: 19 Gauge Catheter at skin depth: 12 cm Test dose: negative  Assessment Events: blood not aspirated, injection not painful, no injection resistance, no paresthesia and negative IV test

## 2021-02-22 NOTE — H&P (Signed)
OBSTETRIC ADMISSION HISTORY AND PHYSICAL  Leshea Oretha Ellis is a 31 y.o. female G2P0101 with IUP at [redacted]w[redacted]d by LMP presenting for IOL due to [facility] 4/8 during post-dates BPP via MFM. She reports +FMs, No LOF, no VB.  She plans on breast feeding. She request pills for birth control. She received her prenatal care at [facility]   Dating: By LMP --->  Estimated Date of Delivery: 02/17/21  Sono:    @[redacted]w[redacted]d , CWD, normal anatomy, cephalic presentation, 3396g, EFW   Prenatal History/Complications:  --History of pre-term delivery, now currently at term and not on medications for this pregnancy   --Language barrier: spanish   Past Medical History: Past Medical History:  Diagnosis Date   Abscess of breast, antepartum    not during pregnancy or breast feeeding drainged and antibiotics resolved no surgery   Medical history non-contributory    Preterm labor     Past Surgical History: Past Surgical History:  Procedure Laterality Date   NO PAST SURGERIES      Obstetrical History: OB History     Gravida  2   Para  1   Term      Preterm  1   AB      Living  1      SAB      IAB      Ectopic      Multiple  0   Live Births  1           Social History Social History   Socioeconomic History   Marital status: Married    Spouse name: Not on file   Number of children: 1   Years of education: Not on file   Highest education level: High school graduate  Occupational History   Not on file  Tobacco Use   Smoking status: Never   Smokeless tobacco: Never  Vaping Use   Vaping Use: Never used  Substance and Sexual Activity   Alcohol use: No   Drug use: No   Sexual activity: Yes  Other Topics Concern   Not on file  Social History Narrative   Not on file   Social Determinants of Health   Financial Resource Strain: Not on file  Food Insecurity: Food Insecurity Present   Worried About Running Out of Food in the Last Year: Sometimes true   Ran Out of Food in the Last  Year: Sometimes true  Transportation Needs: No Transportation Needs   Lack of Transportation (Medical): No   Lack of Transportation (Non-Medical): No  Physical Activity: Not on file  Stress: Not on file  Social Connections: Not on file    Family History: Family History  Problem Relation Age of Onset   Diabetes Father    Diabetes Paternal Grandmother     Allergies: Allergies  Allergen Reactions   Pork-Derived Products Hives   Penicillins Rash    Has patient had a PCN reaction causing immediate rash, facial/tongue/throat swelling, SOB or lightheadedness with hypotension: No Has patient had a PCN reaction causing severe rash involving mucus membranes or skin necrosis: No Has patient had a PCN reaction that required hospitalization No Has patient had a PCN reaction occurring within the last 10 years: No If all of the above answers are "NO", then may proceed with Cephalosporin use.     Medications Prior to Admission  Medication Sig Dispense Refill Last Dose   ferrous sulfate (FERROUSUL) 325 (65 FE) MG tablet Take 1 tablet (325 mg total) by mouth every other  day. 60 tablet 1    Prenatal Multivit-Min-Fe-FA (PRE-NATAL FORMULA) TABS Take by mouth.        Review of Systems   All systems reviewed and negative except as stated in HPI  Blood pressure 116/77, pulse 82, temperature 98 F (36.7 C), temperature source Oral, resp. rate 16, height 5' (1.524 m), weight 74.8 kg, last menstrual period 05/13/2020. General appearance: alert, cooperative, and mild distress with ctx  Lungs: Normal WOB  Heart: regular rate  Abdomen: soft, non-tender Pelvic: NEFG Extremities: Homans sign is negative, no sign of DVT Presentation: cephalic Fetal monitoring Baseline: 125 bpm, Variability: Good {> 6 bpm), Accelerations: Reactive, and Decelerations: Absent Uterine activity every 2 min  Dilation: 6 Effacement (%): 90 Station: -2 Exam by:: Annia Friendly DO    Prenatal labs: ABO, Rh: --/--/O POS (02/17  2876) Antibody: NEG (02/17 8115) Rubella: 2.10 (07/13 1143) RPR: Non Reactive (11/21 0832)  HBsAg: Negative (07/13 1143)  HIV: Non Reactive (11/21 7262)  GBS: Negative/-- (01/23 1138)  2 hr Glucola passed Genetic screening  LR NIPS Anatomy US normal   Prenatal Transfer Tool  Maternal Diabetes: No Genetic Screening: Normal Maternal Ultrasounds/Referrals: Normal Fetal Ultrasounds or other Referrals:  None Maternal Substance Abuse:  No Significant Maternal Medications:  None Significant Maternal Lab Results: Group B Strep negative  Results for orders placed or performed during the hospital encounter of 02/22/21 (from the past 24 hour(s))  CBC   Collection Time: 02/22/21  9:38 AM  Result Value Ref Range   WBC 10.4 4.0 - 10.5 K/uL   RBC 4.56 3.87 - 5.11 MIL/uL   Hemoglobin 11.9 (L) 12.0 - 15.0 g/dL   HCT 03.5 59.7 - 41.6 %   MCV 81.1 80.0 - 100.0 fL   MCH 26.1 26.0 - 34.0 pg   MCHC 32.2 30.0 - 36.0 g/dL   RDW 38.4 (H) 53.6 - 46.8 %   Platelets 259 150 - 400 K/uL   nRBC 0.0 0.0 - 0.2 %  Type and screen   Collection Time: 02/22/21  9:38 AM  Result Value Ref Range   ABO/RH(D) O POS    Antibody Screen NEG    Sample Expiration      02/25/2021,2359 Performed at [facility], [address] [place], [place] [address]   Resp Panel by RT-PCR (Flu A&B, Covid) Nasopharyngeal Swab   Collection Time: 02/22/21  9:44 AM   Specimen: Nasopharyngeal Swab; Nasopharyngeal(NP) swabs in vial transport medium  Result Value Ref Range   SARS Coronavirus 2 by RT PCR NEGATIVE NEGATIVE   Influenza A by PCR NEGATIVE NEGATIVE   Influenza B by PCR NEGATIVE NEGATIVE    Patient Active Problem List   Diagnosis Date Noted   Post-dates pregnancy 02/22/2021   Supervision of high risk pregnancy, antepartum 07/18/2020   History of preterm delivery, currently pregnant 07/18/2020   Language barrier 07/18/2020    Assessment/Plan:  Shaunee Oretha Ellis is a 31 y.o. G2P0101 at [redacted]w[redacted]d here for IOL  due to [facility] 4/8 via MFM today, however found to be in spontaneous labor and 6cm upon arrival.   #Labor: Expectant management for now, anticipate SVD shortly.  #Pain: Planning for epidural  #FWB: Cat I  #ID: GBS Negative  #MOF: breastfeeding  #MOC: pills  #Circ: NA   Allayne Stack, DO  02/22/2021, 12:21 PM

## 2021-02-22 NOTE — Lactation Note (Signed)
This note was copied from a baby's chart. Lactation Consultation Note  Patient Name: Sherri [name] [id number] Date: 02/22/2021 Reason for consult: Initial assessment;Mother's request;Term;Breastfeeding assistance Age:29 hours  LC assisted with latching with signs of milk transfer. Mom feeding plan is breast and bottle with formula. Mom aware call RN at anytime to get a bottle of formula.   Plan 1 to feed based on cues 8-12x 24hr period. Mom to offer breasts and look for signs of milk transfer.  2. Mom to supplement with EBM first followed by formula 5-7 ml per feeding with slow flow nipple and pace bottle feeding.  3. Manual pump q 3 hrs for 10 min each breast.  4. I and O sheet reviewed.  All questions answered at the end of the visit.   Maternal Data Has patient been taught Hand Expression?: Yes Does the patient have breastfeeding experience prior to this delivery?: Yes How long did the patient breastfeed?: 1 year with her son 5 years ago offering both breast and formula  Feeding Mother's Current Feeding Choice: Breast Milk and Formula  LATCH Score Latch: Repeated attempts needed to sustain latch, nipple held in mouth throughout feeding, stimulation needed to elicit sucking reflex.  Audible Swallowing: Spontaneous and intermittent  Type of Nipple: Everted at rest and after stimulation  Comfort (Breast/Nipple): Soft / non-tender  Hold (Positioning): Assistance needed to correctly position infant at breast and maintain latch.  LATCH Score: 8   Lactation Tools Discussed/Used Tools: Pump;Flanges Flange Size: 24 Breast pump type: Manual Pump Education: Setup, frequency, and cleaning;Milk Storage Reason for Pumping: increase stimulation Pumping frequency: every 3 hrs for 10 min each breast  Interventions Interventions: Breast feeding basics reviewed;Assisted with latch;Skin to skin;Breast massage;Hand express;Breast compression;Adjust position;Support  pillows;Position options;Expressed milk;Hand pump;Education;Pace feeding;LC Psychologist, educational;Infant Driven Feeding Algorithm education  Discharge Pump: Manual WIC Program: Yes  Consult Status Consult Status: Follow-up Date: 02/23/21 Follow-up type: In-patient    Vraj Denardo  Nicholson-Springer 02/22/2021, 5:36 PM

## 2021-02-23 NOTE — Progress Notes (Signed)
Post Partum Day 1 Subjective: no complaints, up ad lib, voiding, tolerating PO, and she was seen with Spanish interpreter  Objective: Blood pressure 105/79, pulse 79, temperature 98.1 F (36.7 C), temperature source Oral, resp. rate 18, height 5' (1.524 m), weight 74.8 kg, last menstrual period 05/13/2020, SpO2 100 %, unknown if currently breastfeeding.  Physical Exam:  General: alert, cooperative, and no distress Lochia: appropriate Uterine Fundus: firm Incision: n/a DVT Evaluation: No evidence of DVT seen on physical exam.  Recent Labs    02/22/21 0938  HGB 11.9*  HCT 37.0    Assessment/Plan: Breastfeeding   LOS: 1 day   Scheryl Darter 02/23/2021, 10:43 AM

## 2021-02-23 NOTE — Clinical Social Work Maternal (Signed)
°CLINICAL SOCIAL WORK MATERNAL/CHILD NOTE ° °Patient Details  °Name: Sherri [name] °MRN: [id number] °Date of Birth: 05/23/1990 ° °Date:  02/23/2021 ° °Clinical Social Worker Initiating Note:  Delsy Etzkorn, LCSW Date/Time: Initiated:  02/23/21/1318    ° °Child's Name:  Sherri [name]  ° °Biological Parents:  Mother, Father (Father: Sherri [name])  ° °Need for Interpreter:  Spanish  ° °Reason for Referral:  Other (Comment) (Food Insecurity)  ° °Address:  [address] °[place] [address]  °  °Phone number:  [phone] (home)    ° °Additional phone number:  ° °Household Members/Support Persons (HM/SP):   Household Member/Support Person 1, Household Member/Support Person 2 ° ° °HM/SP Name Relationship DOB or Age  °HM/SP -1 Sherri [name] FOB    °HM/SP -2 Sherri [name] son 07/11/15  °HM/SP -3        °HM/SP -4        °HM/SP -5        °HM/SP -6        °HM/SP -7        °HM/SP -8        ° ° °Natural Supports (not living in the home):  Parent, Immediate Family  ° °Professional Supports: None  ° °Employment: Unemployed  ° °Type of Work:    ° °Education:  High school graduate  ° °Homebound arranged:   ° °Financial Resources:  Self-Pay    ° °Other Resources:  WIC  ° °Cultural/Religious Considerations Which May Impact Care:   ° °Strengths:  Ability to meet basic needs  , Pediatrician chosen, Home prepared for child    ° °Psychotropic Medications:        ° °Pediatrician:    [place] area ° °Pediatrician List:  ° °[facility]  °[place]    °[place]    °[place]    °[place]    °[place]    ° ° °Pediatrician Fax Number:   ° °Risk Factors/Current Problems:  Basic Needs    ° °Cognitive State:  Able to Concentrate  , Alert  , Insightful  , Goal Oriented  , Linear Thinking    ° °Mood/Affect:  Calm  , Happy  , Relaxed  , Interested  , Comfortable    ° °CSW Assessment: CSW met with MOB at bedside to complete psychosocial assessment, FOB present.  CSW utilized [employer] spanish video interpreter (Edmundo #[id number]). CSW introduced self and explained reason for consult. Parents were welcoming, pleasant, open, and remained engaged during assessment. FOB reported that they reside together along with their son. MOB reported that she receives WIC. CSW asked if MOB wanted the link to apply for food stamps, MOB reported yes. CSW agreed to send the link to apply for food stamps. CSW inquired about food insecurity, FOB reported that they sometimes go to churches to get food if their biweekly income doesn't cover food costs. CSW informed parents about [employer] food market and provided information on how to utilize the resource, FOB thanked CSW. Parents reported that they have some items for infant including a car seat, crib, diapers, and some clothes. MOB reported that they don't have enough clothes/blankets, CSW asked if a baby bundle would be helpful. Parents reported yes, CSW agreed to provide. Parents reported that they are interested in medicaid for MOB and infant. CSW agreed to notify financial counselor who would follow up with MOB, MOB agreeable. CSW asked if   parents had any issues with transportation, FOB reported no issues with transportation and that they have a way to get around. CSW inquired about any additional needs, parents reported none. CSW informed parents about [employer] as community support and inquired about interest, parents reported that they are interested and agreeable to referral. CSW agreed to make referral. CSW inquired about parents support system, FOB reported that his brother and MOB's father are supports.  ° °CSW provided review of Sudden Infant Death Syndrome (SIDS) precautions.   ° °CSW asked FOB to leave the room to speak with MOB privately, FOB left the room.  ° °CSW inquired about MOB's mental health history, MOB denied any mental health history. CSW inquired about any history of postpartum  depression, MOB reported yes. MOB reported that her postpartum depression lasted about 2-3 weeks and described it as being sad, crying, and isolating. MOB reported that her symptoms went away on their own after returning to work and being distracted. CSW inquired about how MOB was feeling emotionally after giving birth, MOB reported that she was feeling good. MOB presented calm and did not demonstrate any acute mental health signs/symptoms. CSW assessed for safety, MOB denied SI, HI, and domestic violence.  ° °CSW provided education regarding the baby blues period vs. perinatal mood disorders, discussed treatment and gave resources for mental health follow up if concerns arise.  CSW recommends self-evaluation during the postpartum time period using the New Mom Checklist from Postpartum Progress and encouraged MOB to contact a medical professional if symptoms are noted at any time.   ° ° °CSW sent link to apply for food stamps.  °CSW provided 2 baby bundles.  °CSW notified Financial Counselor that MOB requested follow up for Medicaid.  °CSW will complete [employer] referral.  ° °CSW identifies no further need for intervention and no barriers to discharge at this time. ° ° °CSW Plan/Description:  No Further Intervention Required/No Barriers to Discharge, Sudden Infant Death Syndrome (SIDS) Education, Perinatal Mood and Anxiety Disorder (PMADs) Education, Other Information/Referral to Community Resources, Other Patient/Family Education  ° ° °Miyoko Hashimi L Debra Calabretta, LCSW °02/23/2021, 1:23 PM °

## 2021-02-24 ENCOUNTER — Inpatient Hospital Stay (HOSPITAL_COMMUNITY): Payer: Medicaid Other

## 2021-02-24 NOTE — Lactation Note (Addendum)
This note was copied from a baby's chart. Lactation Consultation Note  Patient Name: Sherri [name] [id number] Date: 02/24/2021 Reason for consult: Mother's request;Follow-up assessment;Term Age:29 hours Spanish interpreter used. Mom's feeding choice is breast and formula feeding. P2, ETI female infant, -2% weight loss, infant is breastfeeding and supplementing with formula.  LC observed Mom has a few  mild abrasions on her breast. LC notice mom was not using pillow support and using scissor hand position infant latched appeared shallow. Mom open to using pillow under infant and trying the "U hold"hand position. Mom re-latched infant on her left breast breast using the cradle hold position with support, swallows heard and infant latched with depth, per mom, no pain with latch, infant breastfeed for 15 minutes. Mom switched and latched infant on her right breast with cradle hold and infant was still breastfeeding after 20 minutes when LC left the room. Per mom, she is not feeling any pain with latch and this is the longest infant has breastfeed.  Mom's plan: 1- Mom will latch infant at breast for every feeding according to hunger cues, 8 to 12+ or more times within 24 hours. 2- Mom will attempt to latch infant on both breast during a feeding. 3- Mom knows if she feels pain and not a tug when BF infant to break latch and re-latch infant, to ask RN/LC for further latch assistance if needed. 4- Mom will continue to supplement infant with formula after latching infant at breast this is her choice.  Maternal Data    Feeding Mother's Current Feeding Choice: Breast Milk and Formula Nipple Type: Slow - flow  LATCH Score Latch: Grasps breast easily, tongue down, lips flanged, rhythmical sucking.  Audible Swallowing: Spontaneous and intermittent  Type of Nipple: Everted at rest and after stimulation  Comfort (Breast/Nipple): Filling, red/small blisters or bruises, mild/mod  discomfort  Hold (Positioning): Assistance needed to correctly position infant at breast and maintain latch.  LATCH Score: 8   Lactation Tools Discussed/Used    Interventions Interventions: Assisted with latch;Skin to skin;Breast compression;Adjust position;Support pillows;Position options;Education  Discharge    Consult Status Consult Status: Follow-up Date: 02/24/21 Follow-up type: In-patient    Sherri [name] 02/24/2021, 2:14 AM

## 2021-02-24 NOTE — Lactation Note (Signed)
This note was copied from a baby's chart. Lactation Consultation Note  Patient Name: Girl Ascencion Elgie Congo [id number] Date: 02/24/2021 Reason for consult: Follow-up assessment Age:29 hours  P2, Spanish interpreter Lily used via video. Mother states she is concerned about her milk supply because baby wants to breastfeeding frequently. Reviewed hand expression with good flow.  Mother was happy to see transitional breastmilk. Observed latch with frequent swallows.  Mother has been breastfeeding for only 10 min per side and then giving formula.  Encouraged breastfeeding before offering formula.   Reviewed engorgement care and monitoring voids/stools.   Maternal Data Has patient been taught Hand Expression?: Yes  Feeding Mother's Current Feeding Choice: Breast Milk and Formula Nipple Type: Slow - flow  LATCH Score Latch: Grasps breast easily, tongue down, lips flanged, rhythmical sucking.  Audible Swallowing: Spontaneous and intermittent  Type of Nipple: Everted at rest and after stimulation  Comfort (Breast/Nipple): Soft / non-tender  Hold (Positioning): Assistance needed to correctly position infant at breast and maintain latch.  LATCH Score: 9   Lactation Tools Discussed/Used    Interventions Interventions: Breast feeding basics reviewed;Assisted with latch;Skin to skin;Education  Discharge Discharge Education: Engorgement and breast care;Warning signs for feeding baby  Consult Status Consult Status: Complete Date: 02/24/21    Vivianne Master [facility] 02/24/2021, 9:20 AM

## 2021-03-05 ENCOUNTER — Telehealth (HOSPITAL_COMMUNITY): Payer: Self-pay | Admitting: *Deleted

## 2021-03-05 NOTE — Telephone Encounter (Signed)
Mom reports feeling good per interpreter. No concerns about herself at this time. EPDS=1(Hospital score=2) Mom reports baby is doing well. Feeding, peeing, and pooping without difficulty. Safe sleep reviewed. Mom reports no concerns about baby at present.  Odis Hollingshead, RN 03-05-2021 at 4:00pm

## 2021-03-20 ENCOUNTER — Telehealth: Payer: Self-pay | Admitting: Family

## 2021-03-20 DIAGNOSIS — N3000 Acute cystitis without hematuria: Secondary | ICD-10-CM

## 2021-03-20 MED ORDER — SULFAMETHOXAZOLE-TRIMETHOPRIM 800-160 MG PO TABS: 1.0000 | ORAL_TABLET | Freq: Two times a day (BID) | ORAL | 0 refills | Status: DC

## 2021-03-20 NOTE — Patient Instructions (Signed)
Infección urinaria en los adultos °Urinary Tract Infection, Adult °Una infección urinaria (IU) puede ocurrir en cualquier lugar de las vías urinarias. Las vías urinarias incluyen a los riñones, los uréteres, la vejiga y la uretra. Estos órganos fabrican, almacenan y eliminan la orina del organismo. °La IU alta afecta los uréteres y los riñones. La IU baja afecta la vejiga y la uretra. °¿Cuáles son las causas? °La mayoría de las infecciones de las vías urinarias es causada por la presencia de bacterias en la zona genital, alrededor de la uretra, por donde sale la orina del cuerpo. Estas bacterias proliferan y causan inflamación en las vías urinarias. °¿Qué incrementa el riesgo? °Es más probable que sufra esta afección si: °Tiene colocado un catéter urinario permanente. °No puede controlar cuándo orinar o defecar (incontinencia). °Es mujer y usted: °Utiliza espermicida o diafragma como método anticonceptivo. °Tiene niveles bajos de estrógenos. °Está embarazada. °Tiene ciertos genes que aumentan su riesgo. °Es sexualmente activa. °Toma antibióticos. °Tiene una afección que causa que el flujo de orina sea lento, como: °Próstata agrandada, si usted es hombre. °Obstrucción de la uretra. °Cálculo renal. °Una afección nerviosa que afecta el control de la vejiga (vejiga neurógena). °No bebe lo suficiente o no orina con frecuencia. °Tiene ciertas enfermedades crónicas, como: °Diabetes. °Un sistema que combate las enfermedades (sistemainmunitario) debilitado. °Anemia drepanocítica. °Gota. °Lesión en la médula espinal. °¿Cuáles son los signos o síntomas? °Los síntomas de esta afección incluyen: °Necesidad inmediata (urgencia) de orinar. °Micción frecuente. Esto puede incluir pequeñas cantidades de orina cada vez que orina. °Ardor o dolor al orinar. °Presencia de sangre en la orina. °Orina con mal olor u olor atípico. °Dificultad para orinar. °Orina turbia. °Secreción vaginal, si es mujer. °Dolor en el abdomen o en la parte  inferior de la espalda. °Es posible que también tenga: °Vómitos o disminución del apetito. °Confusión. °Irritabilidad o cansancio. °Fiebre o escalofríos. °Diarrea. °El primer síntoma en los adultos mayores puede ser la confusión. En algunos casos, es posible que no tengan síntomas hasta que la infección empeore. °¿Cómo se diagnostica? °Esta afección se diagnostica en función de sus antecedentes médicos y de un examen físico. También pueden hacerle otras pruebas, incluidas las siguientes: °Análisis de orina. °Análisis de sangre. °Pruebas de infecciones de transmisión sexual (ITS). °Si ha tenido más de una infección urinaria (IU), se pueden hacer estudios de diagnóstico por imágenes o una cistoscopia para determinar la causa de las infecciones. °¿Cómo se trata? °El tratamiento de esta afección incluye lo siguiente: °Antibióticos. °Medicamentos de venta libre para aliviar las molestias. °Beber una cantidad suficiente agua para mantenerse hidratado. °Si tiene infecciones con frecuencia o tiene otras afecciones, como un cálculo renal, es posible que deba ver a un médico especialista en las vías urinarias (urólogo). °En casos poco frecuentes, las infecciones urinarias pueden provocar sepsis. La sepsis es una afección potencialmente mortal que se produce cuando el cuerpo responde a una infección. La sepsis se trata en el hospital con antibióticos, líquidos y otros medicamentos que se administran por vía intravenosa. °Siga estas instrucciones en su casa: °Medicamentos °Use los medicamentos de venta libre y los recetados solamente como se lo haya indicado el médico. °Si le recetaron un antibiótico, tómelo como se lo haya indicado el médico. No deje de usar el antibiótico aunque comience a sentirse mejor. °Instrucciones generales °Asegúrese de hacer lo siguiente: °Vaciar la vejiga con frecuencia y en su totalidad. No contener la orina durante largos períodos. °Vaciar la vejiga después de tener sexo. °Limpiarse de atrás hacia  adelante después de   orinar o defecar, si es mujer. Usar cada trozo de papel higiénico solo una vez cuando se limpie. °Beber suficiente líquido como para mantener la orina de color amarillo pálido. °Cumpla con todas las visitas de seguimiento. Esto es importante. °Comuníquese con un médico si: °Los síntomas no mejoran después de 1 o 2 días de tratamiento. °Los síntomas desaparecen y luego vuelven a aparecer. °Solicite ayuda de inmediato si: °Siente dolor intenso en la espalda o en la parte inferior del abdomen. °Tiene fiebre o escalofríos. °Tiene náuseas o vómitos. °Resumen °Una infección urinaria (IU) es una infección en cualquier parte de las vías urinarias, que incluyen los riñones, los uréteres, la vejiga y la uretra. °La mayoría de las infecciones de las vías urinarias es causada por bacterias en la zona genital. °El tratamiento de esta afección suele incluir antibióticos. °Si le recetaron un antibiótico, tómelo como se lo haya indicado el médico. No deje de usar el antibiótico aunque comience a sentirse mejor. °Cumpla con todas las visitas de seguimiento. Esto es importante. °Esta información no tiene como fin reemplazar el consejo del médico. Asegúrese de hacerle al médico cualquier pregunta que tenga. °Document Revised: 10/17/2019 Document Reviewed: 10/17/2019 °Elsevier Patient Education © 2022 Elsevier Inc. ° °

## 2021-03-20 NOTE — Progress Notes (Signed)
?Virtual Visit Consent  ? ?Sherri [name], you are scheduled for a virtual visit with a [facility] provider today.   ?  ?Just as with appointments in the office, your consent must be obtained to participate.  Your consent will be active for this visit and any virtual visit you may have with one of our providers in the next 365 days.   ?  ?If you have a MyChart account, a copy of this consent can be sent to you electronically.  All virtual visits are billed to your insurance company just like a traditional visit in the office.   ? ?As this is a virtual visit, video technology does not allow for your provider to perform a traditional examination.  This may limit your provider's ability to fully assess your condition.  If your provider identifies any concerns that need to be evaluated in person or the need to arrange testing (such as labs, EKG, etc.), we will make arrangements to do so.   ?  ?Although advances in technology are sophisticated, we cannot ensure that it will always work on either your end or our end.  If the connection with a video visit is poor, the visit may have to be switched to a telephone visit.  With either a video or telephone visit, we are not always able to ensure that we have a secure connection.    ? ?I need to obtain your verbal consent now.   Are you willing to proceed with your visit today?  ?  ?Sherri [name] has provided verbal consent on 03/20/2021 for a virtual visit (video or telephone). ?  ?Evelina Dun, FNP  ? ?Date: 03/20/2021 4:46 PM ? ? ?Virtual Visit via Video Note  ? Sherri [name], connected with  Sherri [name]  ([id number], [birth year]) on 03/20/21 at  4:45 PM EDT by a video-enabled telemedicine application and verified that I am speaking with the correct person using two identifiers. ? ?Interpreter used.  ? ?Location: ?Patient: Virtual Visit Location Patient: Home ?Provider: Virtual Visit Location Provider: Home Office ?  ?I discussed the limitations of  evaluation and management by telemedicine and the availability of in person appointments. The patient expressed understanding and agreed to proceed.   ? ?History of Present Illness: ?Sherri [name] is a 31 y.o. who identifies as a female who was assigned female at birth, and is being seen today for dysuria. ? ?HPI: Dysuria  ?This is a new problem. The current episode started in the past 7 days. The problem occurs intermittently. The problem has been waxing and waning. The quality of the pain is described as burning. The pain is at a severity of 1/10. There has been no fever. Associated symptoms include frequency, hematuria, hesitancy and urgency. Pertinent negatives include no nausea or vomiting. She has tried increased fluids for the symptoms. The treatment provided mild relief.   ?Problems:  ?Patient Active Problem List  ? Diagnosis Date Noted  ? Post-dates pregnancy 02/22/2021  ? Supervision of high risk pregnancy, antepartum 07/18/2020  ? History of preterm delivery, currently pregnant 07/18/2020  ? Language barrier 07/18/2020  ?  ?Allergies:  ?Allergies  ?Allergen Reactions  ? Pork-Derived Products Hives  ? Penicillins Rash  ?  Has patient had a PCN reaction causing immediate rash, facial/tongue/throat swelling, SOB or lightheadedness with hypotension: No ?Has patient had a PCN reaction causing severe rash involving mucus membranes or skin necrosis: No ?Has patient had a PCN reaction that required hospitalization No ?Has patient had a PCN  reaction occurring within the last 10 years: No ?If all of the above answers are "NO", then may proceed with Cephalosporin use. ?  ? ?Medications:  ?Current Outpatient Medications:  ?  sulfamethoxazole-trimethoprim (BACTRIM DS) 800-160 MG tablet, Take 1 tablet by mouth 2 (two) times daily., Disp: 14 tablet, Rfl: 0 ? ?Observations/Objective: ?Patient is well-developed, well-nourished in no acute distress.  ?Resting comfortably  at home.  ?Head is normocephalic,  atraumatic.  ?No labored breathing.  ?Speech is clear and coherent with logical content.  ?Patient is alert and oriented at baseline.  ? ? ?Assessment and Plan: ?1. Acute cystitis without hematuria ?- sulfamethoxazole-trimethoprim (BACTRIM DS) 800-160 MG tablet; Take 1 tablet by mouth 2 (two) times daily.  Dispense: 14 tablet; Refill: 0 ? ?Force fluids ?AZO over the counter X2 days ?Follow up if symptoms worsen or do not improve  ? ?Follow Up Instructions: ?I discussed the assessment and treatment plan with the patient. The patient was provided an opportunity to ask questions and all were answered. The patient agreed with the plan and demonstrated an understanding of the instructions.  A copy of instructions were sent to the patient via MyChart unless otherwise noted below.  ? ? ? ?The patient was advised to call back or seek an in-person evaluation if the symptoms worsen or if the condition fails to improve as anticipated. ? ?Time:  ?I spent 10 minutes with the patient via telehealth technology discussing the above problems/concerns.   ? ?Evelina Dun, FNP ? ?

## 2021-04-16 ENCOUNTER — Ambulatory Visit: Payer: Self-pay | Admitting: Medical

## 2021-04-18 ENCOUNTER — Encounter: Payer: Self-pay | Admitting: Student

## 2021-04-18 ENCOUNTER — Ambulatory Visit (INDEPENDENT_AMBULATORY_CARE_PROVIDER_SITE_OTHER): Payer: Medicaid Other | Admitting: Student

## 2021-04-18 VITALS — BP 112/75 | HR 105 | Wt 144.0 lb

## 2021-04-18 DIAGNOSIS — Z5941 Food insecurity: Secondary | ICD-10-CM

## 2021-04-18 MED ORDER — NORETHIN ACE-ETH ESTRAD-FE 1.5-30 MG-MCG PO TABS: 1.0000 | ORAL_TABLET | Freq: Every day | ORAL | 11 refills | Status: DC

## 2021-04-18 NOTE — Progress Notes (Signed)
? ? ?Post Partum Visit Note ? ?Sherri [name] is a 31 y.o. G61P1102 female who presents for a postpartum visit. She is  7.7  weeks postpartum following a normal spontaneous vaginal delivery.  I have fully reviewed the prenatal and intrapartum course. The delivery was at 40.5 gestational weeks.  Anesthesia: epidural. Postpartum course has been . Baby is doing well. Baby is feeding by breast. Bleeding no bleeding. Bowel function is  Straining/constipation . Bladder function is normal. Patient is not sexually active. Contraception method is none. Postpartum depression screening: negative. ? ? ?The pregnancy intention screening data noted above was reviewed. Potential methods of contraception were discussed. The patient elected to proceed with No data recorded. ? ? Edinburgh Postnatal Depression Scale - 04/18/21 1024   ? ?  ? Edinburgh Postnatal Depression Scale:  In the Past 7 Days  ? I have been able to laugh and see the funny side of things. 0   ? I have looked forward with enjoyment to things. 0   ? I have blamed myself unnecessarily when things went wrong. 0   ? I have been anxious or worried for no good reason. 0   ? I have felt scared or panicky for no good reason. 0   ? Things have been getting on top of me. 0   ? I have been so unhappy that I have had difficulty sleeping. 0   ? I have felt sad or miserable. 1   ? I have been so unhappy that I have been crying. 0   ? The thought of harming myself has occurred to me. 0   ? Edinburgh Postnatal Depression Scale Total 1   ? ?  ?  ? ?  ? ? ?Health Maintenance Due  ?Topic Date Due  ? COVID-19 Vaccine (1) Never done  ? ? ?The following portions of the patient's history were reviewed and updated as appropriate: allergies, current medications, past family history, past medical history, past social history, past surgical history, and problem list. ? ?Review of Systems ?Pertinent items are noted in HPI. ? ?Objective:  ?BP 112/75   Pulse (!) 105   Wt 144 lb (65.3  kg)   Breastfeeding Yes   BMI 28.12 kg/m?   ? ?General:  alert, cooperative, and no distress  ? Breasts:  normal  ?Lungs: clear to auscultation bilaterally  ?Heart:  regular rate and rhythm, S1, S2 normal, no murmur, click, rub or gallop  ?Abdomen: soft, non-tender; bowel sounds normal; no masses,  no organomegaly   ?Wound None  ?GU exam:  normal  ?     ?Assessment:  ? ?1. Food insecurity   ?2. Postpartum care and examination   ? ? ? ?Healthy postpartum exam.  ? ?Plan:  ? ?Essential components of care per ACOG recommendations: ? ?1.  Mood and well being: Patient with negative depression screening today. Reviewed local resources for support.  ?- Patient tobacco use? No.   ?- hx of drug use? No.   ? ?2. Infant care and feeding:  ?-Patient currently breastmilk feeding? Doing well  ?-Social determinants of health (SDOH) reviewed in EPIC. Food insecurity> patient will visit [place] [employer]  ? ?3. Sexuality, contraception and birth spacing ?- Patient does not want a pregnancy in the next year.  Desired family size is 3 children.  ?- Reviewed reproductive life planning. Reviewed contraceptive methods based on pt preferences and effectiveness.  She has had Nexplanon before and did not like it at all.  Patient desired Oral Contraceptive today.  Discussed that milk supply can be affected by estrogen but patient has an oversupply of milk and has been storing milk in her fridge. She will monitor and change methods if she desires. She does not want to get her tubes tied.  ?- Discussed birth spacing of 18 months ? ?4. Sleep and fatigue ?-Encouraged family/partner/community support of 4 hrs of uninterrupted sleep to help with mood and fatigue ? ?5. Physical Recovery  ?- Discussed patients delivery and complications. She describes her labor as good. ?- Patient had a Vaginal, no problems at delivery. Patient had a  no  laceration. Perineal healing reviewed. Patient expressed understanding ?- Patient has urinary incontinence?  No. ?- Patient is safe to resume physical and sexual activity ? ?6.  Health Maintenance ?- HM due items addressed . She reports that her UTI has improved after taking Bactrim; denies any S/S today.  ?- Last pap smear  ?Diagnosis  ?Date Value Ref Range Status  ?08/18/2019   Final  ? - Negative for intraepithelial lesion or malignancy (NILM)  ? Pap smear not done at today's visit.  ?-Breast Cancer screening indicated? No.  ? ?7. Chronic Disease/Pregnancy Condition follow up: None ? ?- PCP follow up ? ?Marylene Land, CNM ?Center for [employer], [facility]  ?

## 2022-04-22 IMAGING — US US MFM OB DETAIL+14 WK
1 series · 13 of 28 positions shown · non-contrast
Comparison: none

[Series 1: us mfm ob detail+14 wk · 74 acquisitions, 13 frames shown]
[im 3/74]
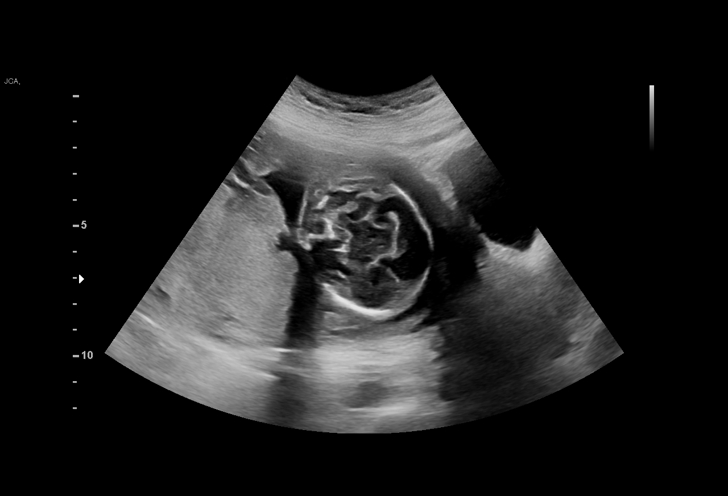
[im 9/74]
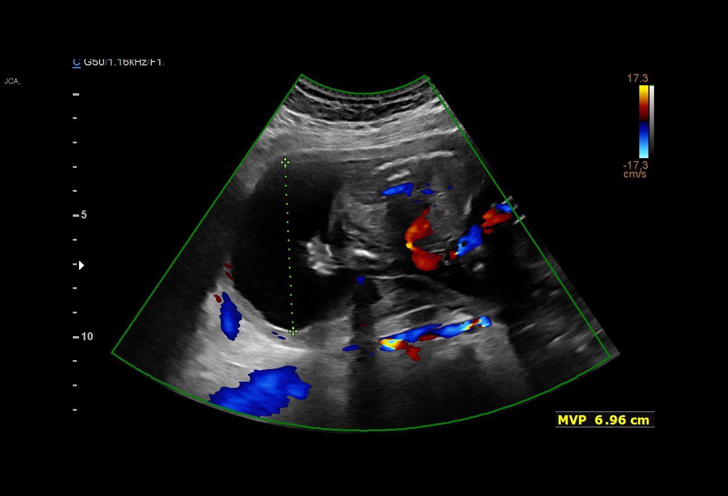
[im 14/74]
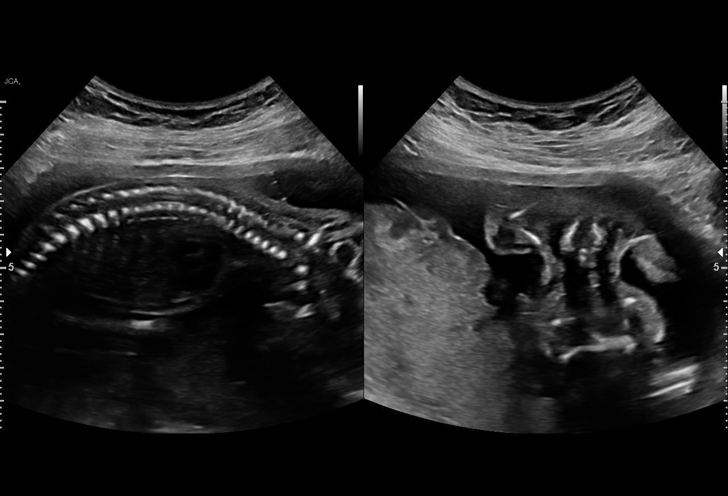
[im 19/74]
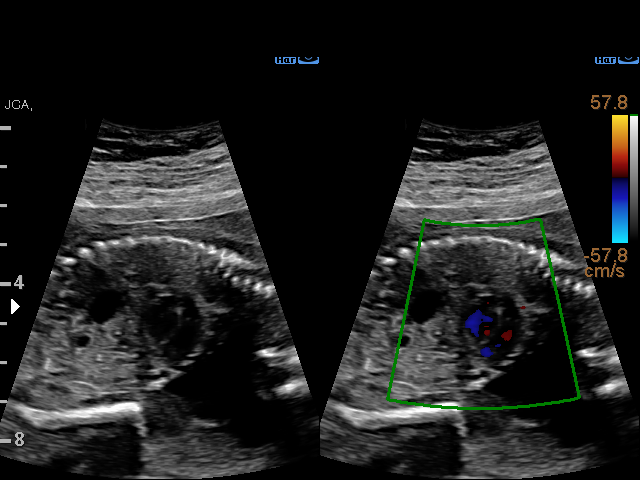
[im 25/74]
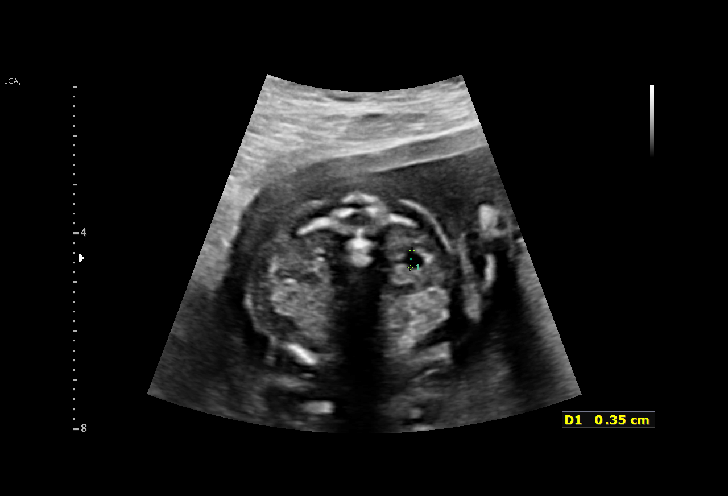
[im 30/74]
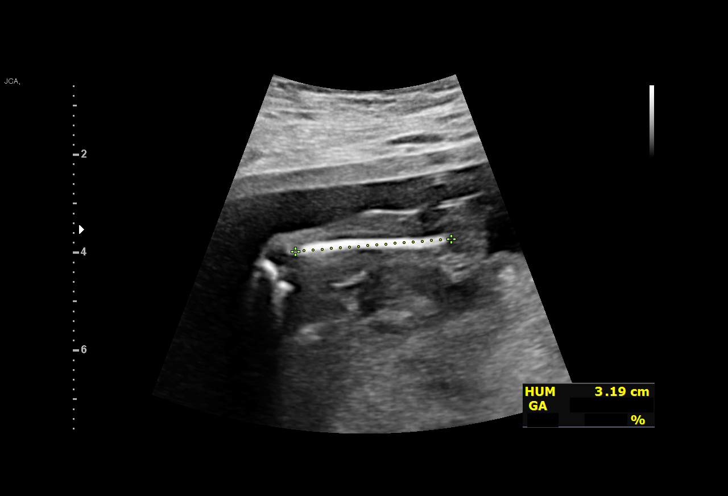
[im 38/74]
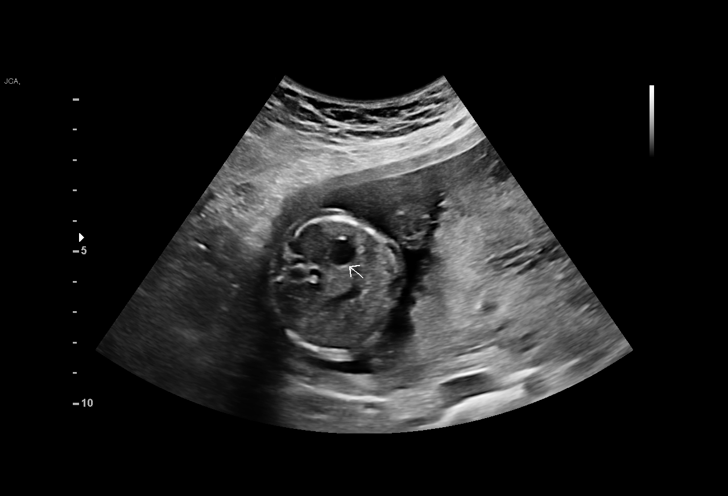
[im 44/74]
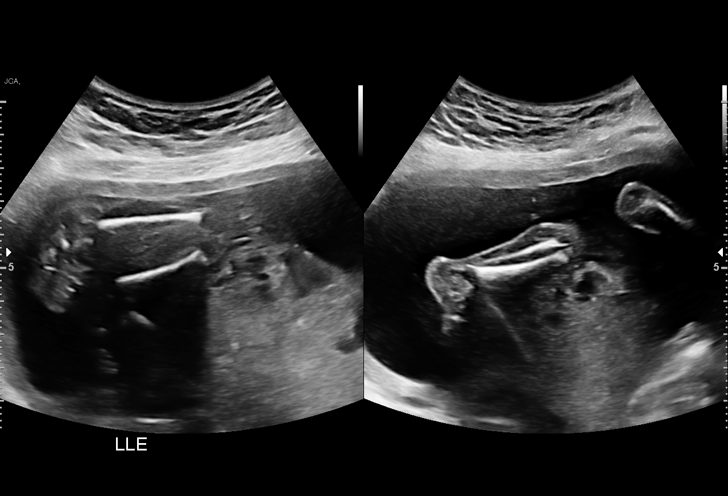
[im 49/74]
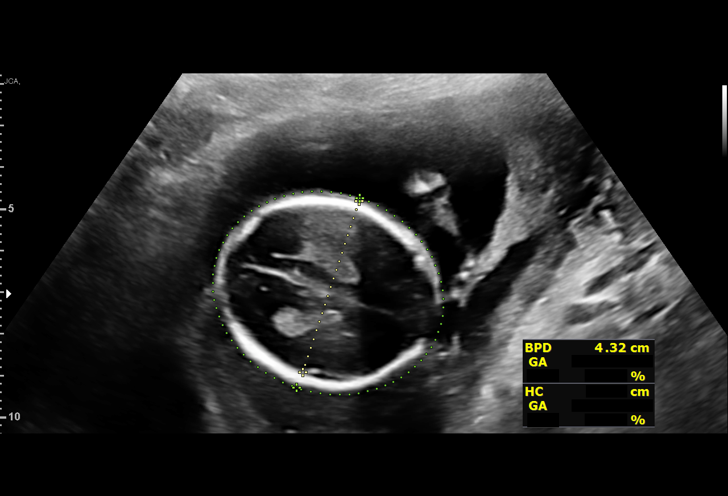
[im 55/74]
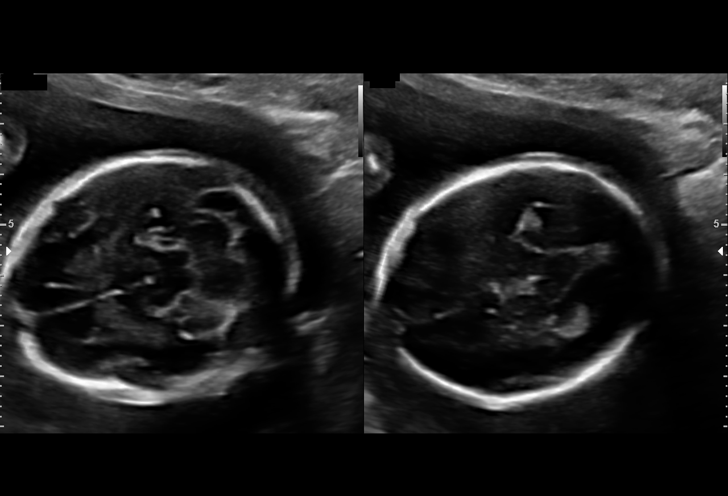
[im 60/74]
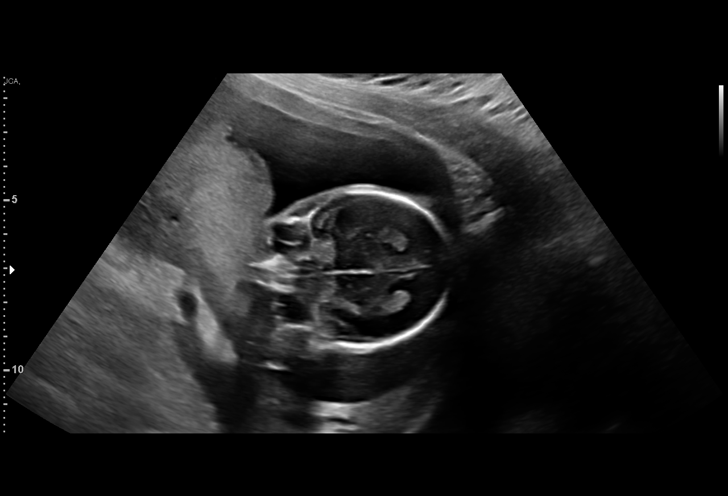
[im 65/74]
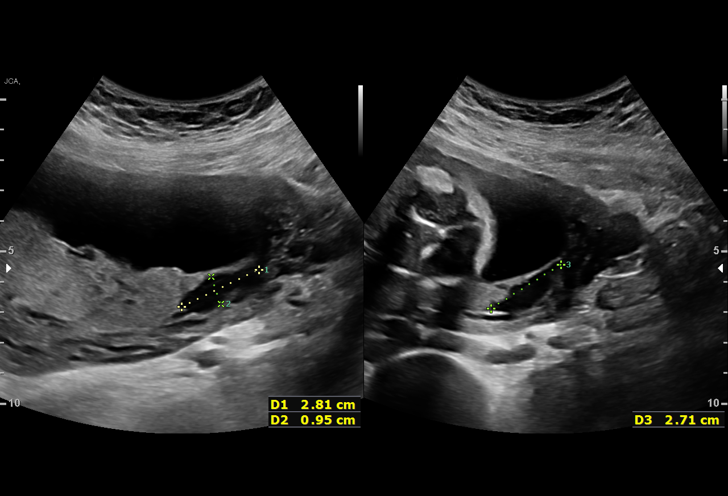
[im 71/74]
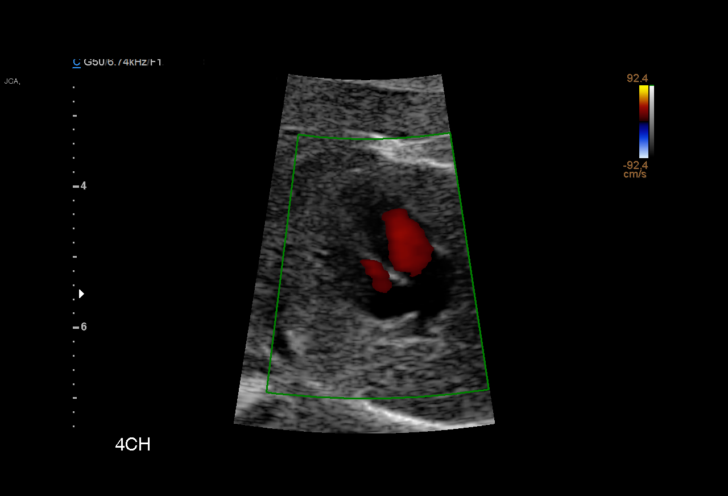

[13 of 28 positions shown; findings below may reference images not displayed]

Indications

 19 weeks gestation of pregnancy
 Encounter for antenatal screening for
 malformations
 Poor obstetric history: Previous preterm
 delivery, antepartum @36 weeks
 Neg AFP
Fetal Evaluation

 Num Of Fetuses:         1
 Fetal Heart Rate(bpm):  136
 Cardiac Activity:       Observed
 Presentation:           Cephalic
 Placenta:               Posterior
 P. Cord Insertion:      Not well visualized

 Amniotic Fluid
 AFI FV:      Within normal limits

                             Largest Pocket(cm)

Biometry

 BPD:      44.2  mm     G. Age:  19w 3d         54  %    CI:        72.66   %    70 - 86
                                                         FL/HC:      19.8   %    16.1 -
 HC:      164.9  mm     G. Age:  19w 1d         39  %    HC/AC:      1.09        1.09 -
 AC:      151.7  mm     G. Age:  20w 3d         80  %    FL/BPD:     73.8   %
 FL:       32.6  mm     G. Age:  20w 1d         75  %    FL/AC:      21.5   %    20 - 24
 HUM:      31.9  mm     G. Age:  20w 5d         87  %
 CER:      20.7  mm     G. Age:  19w 6d         71  %
 NFT:       4.3  mm

 LV:        5.7  mm
 CM:        4.1  mm

 Est. FW:     335  gm    0 lb 12 oz      90  %
OB History

 Gravidity:    2         Term:   0        Prem:   1        SAB:   0
 TOP:          0       Ectopic:  0        Living: 1
Gestational Age

 LMP:           19w 2d        Date:  05/13/20                 EDD:   02/17/21
 U/S Today:     19w 6d                                        EDD:   02/13/21
 Best:          19w 2d     Det. By:  LMP  (05/13/20)          EDD:   02/17/21
Anatomy

 Cranium:               Appears normal         Aortic Arch:            Appears normal
 Cavum:                 Appears normal         Ductal Arch:            Appears normal
 Ventricles:            Appears normal         Diaphragm:              Appears normal
 Choroid Plexus:        Appears normal         Stomach:                Appears normal, left
                                                                       sided
 Cerebellum:            Appears normal         Abdomen:                Appears normal
 Posterior Fossa:       Appears normal         Abdominal Wall:         Appears nml (cord
                                                                       insert, abd wall)
 Nuchal Fold:           Appears normal         Cord Vessels:           Appears normal (3
                                                                       vessel cord)
 Face:                  Orbits nl; profile not Kidneys:                Appear normal
                        well visualized
 Lips:                  Not well visualized    Bladder:                Appears normal
 Thoracic:              Appears normal         Spine:                  Limited views
                                                                       appear normal
 Heart:                 Appears normal         Upper Extremities:      Appears normal
                        (4CH, axis, and
                        situs)
 RVOT:                  Not well visualized    Lower Extremities:      Appears normal
 LVOT:                  Not well visualized

 Other:  Fetus appears to be female. Technically difficult due to fetal position.
         Feet visualized.
Targeted Anatomy

 Thorax
 3 V Trachea View:      Appears normal
Cervix Uterus Adnexa

 Cervix
 Length:           3.06  cm.
 Normal appearance by transabdominal scan.
Impression

 G2 P1.  Patient is here for fetal anatomy scan.
 On cell-free fetal DNA screening, the risks of fetal
 aneuploidies are not increased .MSAFP screening showed
 low risk for open-neural tube defects .
 Obstetric history: Vaginal delivery at 36 weeks' gestation of a
 male infant weighing 5-15 at birth.

 We performed fetal anatomy scan. No makers of
 aneuploidies or fetal structural defects are seen. Fetal
 biometry is consistent with her previously-established dates.
 Amniotic fluid is normal and good fetal activity is seen.
 Patient understands the limitations of ultrasound in detecting
 fetal anomalies.
Recommendations

 -An appointment was made for her to return in 4 weeks for
 completion of fetal anatomy.
                 Gustei, Grigorjevschi

## 2022-09-09 ENCOUNTER — Ambulatory Visit (INDEPENDENT_AMBULATORY_CARE_PROVIDER_SITE_OTHER): Payer: Medicaid Other | Admitting: General Practice

## 2022-09-09 ENCOUNTER — Encounter: Payer: Self-pay | Admitting: General Practice

## 2022-09-09 DIAGNOSIS — N912 Amenorrhea, unspecified: Secondary | ICD-10-CM

## 2022-09-09 DIAGNOSIS — Z3201 Encounter for pregnancy test, result positive: Secondary | ICD-10-CM

## 2022-09-09 LAB — POCT PREGNANCY, URINE: Preg Test, Ur: POSITIVE — AB

## 2022-09-09 NOTE — Progress Notes (Signed)
Patient came by office this morning and dropped off urine sample for UPT. UPT +.  Called patient at home with Debarah Crape assisting with spanish interpretation & informed her of + UPT. Patient reports first positive home test was Monday of last week. LMP 07/10/22 EDD 04/16/23 [redacted]w[redacted]d. She reports some nausea & wants to know if there is a tea she can try- recommended ginger tea/chews, vitamin b6 BID, small frequent meals, suck on popsicles throughout the day; she may call us if she decides she wants to try a prescription. Patient verbalized understanding. She states she fell a few days ago, did not hit her belly but landed on her bottom. Denies pain other than soreness from the fall. Reports spotting a couple days after the fall, one time in her underwear but none since then. Reassured patient that the spotting was likely from something else unrelated to the fall but let us know if she were to have bright red bleeding or bleeding like a period. Discussed she'll return to the office for new OB intake & new OB appt and someone from our office will contact her with those appointments. Patient verbalized understanding.   Chase Caller RN BSN 09/09/22

## 2022-09-25 ENCOUNTER — Encounter: Payer: Self-pay | Admitting: Family Medicine

## 2022-10-01 ENCOUNTER — Ambulatory Visit (INDEPENDENT_AMBULATORY_CARE_PROVIDER_SITE_OTHER): Payer: Self-pay

## 2022-10-01 ENCOUNTER — Other Ambulatory Visit (HOSPITAL_COMMUNITY)
Admission: RE | Admit: 2022-10-01 | Discharge: 2022-10-01 | Disposition: A | Discharge status: Home / Self Care | Payer: Medicaid Other | Source: Ambulatory Visit | Attending: Family Medicine | Admitting: Family Medicine

## 2022-10-01 VITALS — BP 106/74 | HR 84 | Wt 153.1 lb

## 2022-10-01 DIAGNOSIS — O0990 Supervision of high risk pregnancy, unspecified, unspecified trimester: Secondary | ICD-10-CM | POA: Diagnosis present

## 2022-10-01 DIAGNOSIS — O0991 Supervision of high risk pregnancy, unspecified, first trimester: Secondary | ICD-10-CM

## 2022-10-01 DIAGNOSIS — Z3A11 11 weeks gestation of pregnancy: Secondary | ICD-10-CM

## 2022-10-01 NOTE — Patient Instructions (Signed)

## 2022-10-01 NOTE — Progress Notes (Signed)
New OB Intake  I connected with Sherri [name]  on 10/01/22 at  9:15 AM EDT by In [facility] Visit and verified that I am speaking with the correct person using two identifiers. Nurse is located at [facility] and pt is located at [facility].  I discussed the limitations, risks, security and privacy concerns of performing an evaluation and management service by telephone and the availability of in person appointments. I also discussed with the patient that there may be a patient responsible charge related to this service. The patient expressed understanding and agreed to proceed.  I explained I am completing New OB Intake today. We discussed EDD of 04/16/2023, by Last Menstrual Period. Pt is G3P1102. I reviewed her allergies, medications and Medical/Surgical/OB history.    Patient Active Problem List   Diagnosis Date Noted   Post-dates pregnancy 02/22/2021   History of preterm delivery, currently pregnant 07/18/2020   Language barrier 07/18/2020    Concerns addressed today  Delivery Plans Plans to deliver at [facility]. Discussed the nature of our practice with multiple providers including residents and students. Due to the size of the practice, the delivering provider may not be the same as those providing prenatal care.   Patient is not interested in water birth. Offered upcoming OB visit with CNM to discuss further.  MyChart/Babyscripts MyChart access verified. I explained pt will have some visits in office and some virtually. Babyscripts instructions given and order placed. Patient verifies receipt of registration text/e-mail. Account successfully created and app downloaded.  Blood Pressure Cuff/Weight Scale Patient is self-pay; explained patient will be given BP cuff at first prenatal appt. Explained after first prenatal appt pt will check weekly and document in Babyscripts. Patient does have weight scale.  Anatomy US Explained first scheduled [place] will be around 19 weeks. Anatomy US  scheduled for 11/24/22 at 0815a.  Is patient a CenteringPregnancy candidate?     If accepted,    Is patient a Mom+Baby Combined Care candidate?     If accepted, confirm patient does not intend to move from the area for at least 12 months, then notify Mom+Baby staff  Interested in [place]? If yes, send referral and doula dot phrase.   Is patient a candidate for Babyscripts Optimization? No - HROB  First visit review I reviewed new OB appt with patient. Explained pt will be seen by Dr.Eckstat at first visit. Discussed Avelina Laine genetic screening with patient. done [employer].. Routine prenatal labs  collected today.    Last Pap Diagnosis  Date Value Ref Range Status  08/18/2019   Final   - Negative for intraepithelial lesion or malignancy (NILM)    Henrietta Dine, CMA 10/01/2022  9:16 AM

## 2022-10-02 LAB — GC/CHLAMYDIA PROBE AMP (~~LOC~~) NOT AT ARMC
Chlamydia: NEGATIVE
Comment: NEGATIVE
Comment: NORMAL
Neisseria Gonorrhea: NEGATIVE

## 2022-10-02 LAB — CBC/D/PLT+RPR+RH+ABO+RUBIGG...
Antibody Screen: NEGATIVE
Basophils Absolute: 0 10*3/uL (ref 0.0–0.2)
Basos: 1 %
EOS (ABSOLUTE): 0 10*3/uL (ref 0.0–0.4)
Eos: 1 %
HCV Ab: NONREACTIVE
HIV Screen 4th Generation wRfx: NONREACTIVE
Hematocrit: 38.9 % (ref 34.0–46.6)
Hemoglobin: 12.9 g/dL (ref 11.1–15.9)
Hepatitis B Surface Ag: NEGATIVE
Immature Grans (Abs): 0 10*3/uL (ref 0.0–0.1)
Immature Granulocytes: 0 %
Lymphocytes Absolute: 1.9 10*3/uL (ref 0.7–3.1)
Lymphs: 32 %
MCH: 29.6 pg (ref 26.6–33.0)
MCHC: 33.2 g/dL (ref 31.5–35.7)
MCV: 89 fL (ref 79–97)
Monocytes Absolute: 0.4 10*3/uL (ref 0.1–0.9)
Monocytes: 7 %
Neutrophils Absolute: 3.6 10*3/uL (ref 1.4–7.0)
Neutrophils: 59 %
Platelets: 266 10*3/uL (ref 150–450)
RBC: 4.36 x10E6/uL (ref 3.77–5.28)
RDW: 14.2 % (ref 11.7–15.4)
RPR Ser Ql: NONREACTIVE
Rh Factor: POSITIVE
Rubella Antibodies, IGG: 2.1 index (ref >0.99)
WBC: 6 10*3/uL (ref 3.4–10.8)

## 2022-10-02 LAB — HEMOGLOBIN A1C
Est. average glucose Bld gHb Est-mCnc: 114 mg/dL
Hgb A1c MFr Bld: 5.6 % (ref 4.8–5.6)

## 2022-10-02 LAB — HCV INTERPRETATION

## 2022-10-03 LAB — URINE CULTURE, OB REFLEX

## 2022-10-03 LAB — CULTURE, OB URINE

## 2022-10-07 ENCOUNTER — Encounter: Payer: Self-pay | Admitting: Family Medicine

## 2022-10-07 ENCOUNTER — Other Ambulatory Visit: Payer: Self-pay

## 2022-10-07 ENCOUNTER — Ambulatory Visit (INDEPENDENT_AMBULATORY_CARE_PROVIDER_SITE_OTHER): Payer: Self-pay | Admitting: Family Medicine

## 2022-10-07 VITALS — BP 113/75 | HR 85 | Wt 153.0 lb

## 2022-10-07 DIAGNOSIS — O0990 Supervision of high risk pregnancy, unspecified, unspecified trimester: Secondary | ICD-10-CM

## 2022-10-07 DIAGNOSIS — Z758 Other problems related to medical facilities and other health care: Secondary | ICD-10-CM

## 2022-10-07 DIAGNOSIS — Z124 Encounter for screening for malignant neoplasm of cervix: Secondary | ICD-10-CM

## 2022-10-07 DIAGNOSIS — Z603 Acculturation difficulty: Secondary | ICD-10-CM

## 2022-10-07 DIAGNOSIS — O09899 Supervision of other high risk pregnancies, unspecified trimester: Secondary | ICD-10-CM

## 2022-10-07 NOTE — Patient Instructions (Signed)
Opciones de mtodos anticonceptivos Birth Control Options Los mtodos anticonceptivos tambin se denominan anticonceptivos. Los anticonceptivos previenen Firefighter. Hay muchos tipos de anticonceptivos. Trabaje con el mdico para encontrar la opcin ms adecuada para usted. Anticonceptivos que [place] hormonas Estos tipos de anticonceptivos contienen hormonas para Neurosurgeon. Implante anticonceptivo Este es un pequeo tubo que se coloca dentro de la piel del brazo. El tubo Insurance claims handler colocado durante 3 aos como mximo. Inyeccin anticonceptiva Son inyecciones que se aplican cada 3 meses. Pldoras anticonceptivas Esta es una pldora que se toma todos [place]. Debe tomarla a la [employer]. Parche anticonceptivo Este es un parche que se coloca sobre la piel. Se debe cambiar 1 vez por semana durante 3 semanas. Despus de [employer], el parche se debe retirar durante 1semana. Anillo vaginal  Este es un anillo de plstico blando que se coloca dentro de la vagina. El anillo se deja colocado durante 3 semanas. Luego, se debe retirar durante 1 semana. Despus se coloca un nuevo anillo. Mtodos de barrera  Preservativo masculino Este es una cubierta delgada que se coloca sobre el pene antes de tener sexo. El preservativo se desecha despus de Doctor, hospital. Preservativo femenino Este es una cubierta blanda y suelta que se coloca en la vagina antes de [place]. El preservativo se desecha despus de Doctor, hospital. Diafragma El diafragma es una barrera blanda con forma de tazn. Debe estar hecho para adaptarse a su cuerpo. Se coloca en la vagina antes de tener sexo con una sustancia qumica que destruye los espermatozoides llamada espermicida. El Designer, fashion/clothing en la vagina durante 6 a 8 horas despus de tener sexo y debe retirarse en un plazo de 24 horas. El diafragma se debe reemplazar: Cada 1 o 2 aos. Despus de dar a luz. Despus de aumentar ms de 15  libras (6.8kg). Si se somete a una ciruga en la pelvis. Capuchn cervical Este es un capuchn pequeo y [place] se fija sobre el cuello uterino. El cuello uterino es la parte ms baja del tero. Se coloca en la vagina antes del sexo, junto con un espermicida. El capuchn debe fabricarse para usted. El capuchn se debe dejar colocado durante 6 a 8horas despus del sexo. Se debe retirar en un plazo de 48horas. El capuchn cervical debe ser recetado y adaptado a su cuerpo por un mdico. Debe reemplazarse cada 2aos. Esponja Esta es una esponja pequea que se coloca en la vagina antes de [place]. Se debe dejar colocada durante al menos 6horas despus de [employer]. Se debe retirar en un plazo de 30horas y desecharse. Espermicidas Estos son sustancias qumicas que destruyen o impiden que los espermatozoides ingresen al tero. Se pueden presentar en forma de pldora, crema, gel o espuma que se debe colocar en la vagina. Se deben usar al Lowe[employer] Companies de 10 a antes de [employer]. Dispositivo intrauterino Un dispositivo intrauterino (DIU) es un dispositivo que un mdico coloca dentro del tero. Existen dos tipos: DIU hormonal. Este tipo puede permanecer colocado durante 3 a 5aos. DIU de cobre. Este tipo Insurance claims handler colocado durante 10aos. Mtodos anticonceptivos permanentes Ligadura de trompas en la mujer Esto es una ciruga para obstruir las trompas de [place]. Esterilizacin histeroscpica Este es un procedimiento para Scientific laboratory technician un dispositivo en cada trompa de [place]. Este mtodo funciona al cabo de 3 meses. Se deben usar otros mtodos anticonceptivos durante 3 meses. Esterilizacin masculina Esta es una [place], llamada vasectoma, para ligar los conductos  que transportan los espermatozoides en los hombres. Este mtodo funciona al cabo de 3 meses. Se deben usar otros mtodos anticonceptivos durante 3 meses. Mtodos de planificacin natural Esto significa no tener [employer] International la pareja femenina podra quedar embarazada. A continuacin se mencionan algunos mtodos anticonceptivos por planificacin natural: Usar un calendario a fin de: Hacer un seguimiento de la duracin de cada ciclo menstrual. Determinar en [employer] se podra producir Firefighter. Planificar no tener [employer] en que se podra producir Firefighter. Reconocer los signos de la ovulacin y no tener relaciones sexuales durante ese perodo. La pareja femenina puede detectar cundo ser la ovulacin haciendo un seguimiento de su temperatura todos [place]. Tambin puede examinar si hay cambios en la mucosidad que proviene del cuello uterino. Dnde obtener ms informacin Centers for Disease Control and Prevention (Centros para el Control y la Prevencin de [place]): TonerPromos.no Esta informacin no tiene Theme park manager el consejo del mdico. Asegrese de hacerle al mdico cualquier pregunta que tenga. Document Revised: 04/03/2022 Document Reviewed: 04/03/2022 Elsevier Patient Education  2024 [employer].

## 2022-10-07 NOTE — Progress Notes (Signed)
Subjective:   Sherri [name] is a 32 y.o. [id number] at [redacted]w[redacted]d by unsure LMP being seen today for her first obstetrical visit.  Her obstetrical history is significant for  history of preterm delivery . Patient does intend to breast feed. Pregnancy history fully reviewed.  Patient reports no complaints.  HISTORY: OB History  Gravida Para Term Preterm AB Living  3 2 1 1  0 2  SAB IAB Ectopic Multiple Live Births  0 0 0 0 2    # Outcome Date GA Lbr Len/2nd Weight Sex Type Anes PTL Lv  3 Current           2 Term 02/22/21 [redacted]w[redacted]d  7 lb 7 oz (3.374 kg) F Vag-Spont EPI  LIV     Name: Cala Bradford Alara     Apgar1: 9  Apgar5: 9  1 Preterm 07/11/15 [redacted]w[redacted]d 08:37 / 00:17 5 lb 14.6 oz (2.682 kg) M Vag-Spont EPI  LIV     Birth Comments: left ring and pinky finger fused together , PROM     Name: DIAZ BARRIOS,BOY Sita     Apgar1: 7  Apgar5: 9     Last pap smear: Lab Results  Component Value Date   DIAGPAP  08/18/2019    - Negative for intraepithelial lesion or malignancy (NILM)   HPVHIGH Negative 08/18/2019   Due for repeat but self pay, message sent to BCCCP to outreach for collection.  Past Medical History:  Diagnosis Date   Abscess of breast, antepartum    not during pregnancy or breast feeeding drainged and antibiotics resolved no surgery   Medical history non-contributory    Preterm labor    Past Surgical History:  Procedure Laterality Date   NO PAST SURGERIES     Family History  Problem Relation Age of Onset   Diabetes Father    Diabetes Paternal Grandmother    Social History   Tobacco Use   Smoking status: Never   Smokeless tobacco: Never  Vaping Use   Vaping status: Never Used  Substance Use Topics   Alcohol use: No   Drug use: No   Allergies  Allergen Reactions   Pork-Derived Products Hives   Penicillins Rash    Has patient had a PCN reaction causing immediate rash, facial/tongue/throat swelling, SOB or lightheadedness with hypotension: No Has  patient had a PCN reaction causing severe rash involving mucus membranes or skin necrosis: No Has patient had a PCN reaction that required hospitalization No Has patient had a PCN reaction occurring within the last 10 years: No If all of the above answers are "NO", then may proceed with Cephalosporin use.    Current Outpatient Medications on File Prior to Visit  Medication Sig Dispense Refill   Prenatal Vit-Fe Fumarate-FA (MULTIVITAMIN-PRENATAL) 27-0.8 MG TABS tablet Take 1 tablet by mouth daily at 12 noon.     No current facility-administered medications on file prior to visit.     Exam   Vitals:   10/07/22 1351  BP: 113/75  Pulse: 85  Weight: 153 lb (69.4 kg)   Fetal Heart Rate (bpm): 139 (by bedside US)  System: General: well-developed, well-nourished female in no acute distress   Skin: normal coloration and turgor, no rashes   Neurologic: oriented, normal, negative, normal mood   Extremities: normal strength, tone, and muscle mass, ROM of all joints is normal   HEENT PERRLA, extraocular movement intact and sclera clear, anicteric   Neck supple and no masses   Respiratory:  no  respiratory distress      Assessment:   Pregnancy: [id number] Patient Active Problem List   Diagnosis Date Noted   Supervision of high risk pregnancy, antepartum 10/01/2022   History of preterm delivery, currently pregnant 07/18/2020   Language barrier 07/18/2020     Plan:  1. Supervision of high risk pregnancy, antepartum BP and FHR normal Bedside US with dates around 13 weeks, concordant with LMP dating more or less Initial labs drawn. Continue prenatal vitamins. Genetic Screening discussed, NIPS: results pending. Ultrasound discussed; fetal anatomic survey: ordered. Problem list reviewed and updated. The nature of [facility] Practice with multiple MDs and other Advanced Practice Providers was explained to patient; also emphasized that residents, students are  part of our team.  2. History of preterm delivery, currently pregnant At [redacted]w[redacted]d  3. Language barrier spanish  4. Screening for cervical cancer Self pay, due for pap, message sent to BCCCP   Routine obstetric precautions reviewed. Return in 4 weeks (on 11/04/2022) for [facility], ob visit.   Venora Maples, MD/MPH Attending Family Medicine Physician, [facility] for [facility], [facility]

## 2022-10-09 LAB — PANORAMA PRENATAL TEST FULL PANEL:PANORAMA TEST PLUS 5 ADDITIONAL MICRODELETIONS: FETAL FRACTION: 7.3

## 2022-11-04 ENCOUNTER — Encounter: Payer: Self-pay | Admitting: Family Medicine

## 2022-11-04 ENCOUNTER — Other Ambulatory Visit: Payer: Self-pay

## 2022-11-04 ENCOUNTER — Ambulatory Visit (INDEPENDENT_AMBULATORY_CARE_PROVIDER_SITE_OTHER): Payer: Self-pay | Admitting: Family Medicine

## 2022-11-04 VITALS — BP 111/74 | HR 86 | Wt 155.0 lb

## 2022-11-04 DIAGNOSIS — O09899 Supervision of other high risk pregnancies, unspecified trimester: Secondary | ICD-10-CM

## 2022-11-04 DIAGNOSIS — O99619 Diseases of the digestive system complicating pregnancy, unspecified trimester: Secondary | ICD-10-CM

## 2022-11-04 DIAGNOSIS — K219 Gastro-esophageal reflux disease without esophagitis: Secondary | ICD-10-CM

## 2022-11-04 DIAGNOSIS — O0990 Supervision of high risk pregnancy, unspecified, unspecified trimester: Secondary | ICD-10-CM

## 2022-11-04 DIAGNOSIS — Z758 Other problems related to medical facilities and other health care: Secondary | ICD-10-CM

## 2022-11-04 DIAGNOSIS — Z603 Acculturation difficulty: Secondary | ICD-10-CM

## 2022-11-04 MED ORDER — ONDANSETRON 4 MG PO TBDP: 4.0000 mg | ORAL_TABLET | Freq: Four times a day (QID) | ORAL | 5 refills | Status: DC | PRN

## 2022-11-04 MED ORDER — FAMOTIDINE 20 MG PO TABS: 20.0000 mg | ORAL_TABLET | Freq: Two times a day (BID) | ORAL | 3 refills | Status: DC

## 2022-11-04 NOTE — Progress Notes (Signed)
   Subjective:  Sherri [name] is a 32 y.o. V5I4332 at [redacted]w[redacted]d being seen today for ongoing prenatal care.  She is currently monitored for the following issues for this low-risk pregnancy and has History of preterm delivery, currently pregnant; Language barrier; and Supervision of high risk pregnancy, antepartum on their problem list.  Patient reports no complaints.  Contractions: Irritability. Vag. Bleeding: None.  Movement: Absent. Denies leaking of fluid.   The following portions of the patient's history were reviewed and updated as appropriate: allergies, current medications, past family history, past medical history, past social history, past surgical history and problem list. Problem list updated.  Objective:   Vitals:   11/04/22 0940  BP: 111/74  Pulse: 86  Weight: 155 lb (70.3 kg)    Fetal Status: Fetal Heart Rate (bpm): 150   Movement: Absent     General:  Alert, oriented and cooperative. Patient is in no acute distress.  Skin: Skin is warm and dry. No rash noted.   Cardiovascular: Normal heart rate noted  Respiratory: Normal respiratory effort, no problems with respiration noted  Abdomen: Soft, gravid, appropriate for gestational age. Pain/Pressure: Absent     Pelvic: Vag. Bleeding: None     Cervical exam deferred        Extremities: Normal range of motion.     Mental Status: Normal mood and affect. Normal behavior. Normal judgment and thought content.   Urinalysis:      Assessment and Plan:  Pregnancy: G3P1102 at [redacted]w[redacted]d  1. Supervision of high risk pregnancy, antepartum BP and FHR normal AFP declined for financial reasons Has anatomy scan scheduled for 11/24/22 Has financial aid application, has not finished it, encouraged to do so Having reflux, trial pepcid  2. Language barrier Spanish  3. History of preterm delivery, currently pregnant At [redacted]w[redacted]d  Preterm labor symptoms and general obstetric precautions including but not limited to vaginal bleeding,  contractions, leaking of fluid and fetal movement were reviewed in detail with the patient. Please refer to After Visit Summary for other counseling recommendations.  Return in 4 weeks (on 12/02/2022) for [facility], ob visit.   Venora Maples, MD

## 2022-11-04 NOTE — Patient Instructions (Signed)
Opciones de mtodos anticonceptivos Birth Control Options Los mtodos anticonceptivos tambin se denominan anticonceptivos. Los anticonceptivos previenen Firefighter. Hay muchos tipos de anticonceptivos. Trabaje con el mdico para encontrar la opcin ms adecuada para usted. Anticonceptivos que [place] hormonas Estos tipos de anticonceptivos contienen hormonas para Neurosurgeon. Implante anticonceptivo Este es un pequeo tubo que se coloca dentro de la piel del brazo. El tubo Insurance claims handler colocado durante 3 aos como mximo. Inyeccin anticonceptiva Son inyecciones que se aplican cada 3 meses. Pldoras anticonceptivas Esta es una pldora que se toma todos [place]. Debe tomarla a la [employer]. Parche anticonceptivo Este es un parche que se coloca sobre la piel. Se debe cambiar 1 vez por semana durante 3 semanas. Despus de [employer], el parche se debe retirar durante 1semana. Anillo vaginal  Este es un anillo de plstico blando que se coloca dentro de la vagina. El anillo se deja colocado durante 3 semanas. Luego, se debe retirar durante 1 semana. Despus se coloca un nuevo anillo. Mtodos de barrera  Preservativo masculino Este es una cubierta delgada que se coloca sobre el pene antes de tener sexo. El preservativo se desecha despus de Doctor, hospital. Preservativo femenino Este es una cubierta blanda y suelta que se coloca en la vagina antes de [place]. El preservativo se desecha despus de Doctor, hospital. Diafragma El diafragma es una barrera blanda con forma de tazn. Debe estar hecho para adaptarse a su cuerpo. Se coloca en la vagina antes de tener sexo con una sustancia qumica que destruye los espermatozoides llamada espermicida. El Designer, fashion/clothing en la vagina durante 6 a 8 horas despus de tener sexo y debe retirarse en un plazo de 24 horas. El diafragma se debe reemplazar: Cada 1 o 2 aos. Despus de dar a luz. Despus de aumentar ms de 15  libras (6.8kg). Si se somete a una ciruga en la pelvis. Capuchn cervical Este es un capuchn pequeo y [place] se fija sobre el cuello uterino. El cuello uterino es la parte ms baja del tero. Se coloca en la vagina antes del sexo, junto con un espermicida. El capuchn debe fabricarse para usted. El capuchn se debe dejar colocado durante 6 a 8horas despus del sexo. Se debe retirar en un plazo de 48horas. El capuchn cervical debe ser recetado y adaptado a su cuerpo por un mdico. Debe reemplazarse cada 2aos. Esponja Esta es una esponja pequea que se coloca en la vagina antes de [place]. Se debe dejar colocada durante al menos 6horas despus de [employer]. Se debe retirar en un plazo de 30horas y desecharse. Espermicidas Estos son sustancias qumicas que destruyen o impiden que los espermatozoides ingresen al tero. Se pueden presentar en forma de pldora, crema, gel o espuma que se debe colocar en la vagina. Se deben usar al Lowe[employer] Companies de 10 a antes de [employer]. Dispositivo intrauterino Un dispositivo intrauterino (DIU) es un dispositivo que un mdico coloca dentro del tero. Existen dos tipos: DIU hormonal. Este tipo puede permanecer colocado durante 3 a 5aos. DIU de cobre. Este tipo Insurance claims handler colocado durante 10aos. Mtodos anticonceptivos permanentes Ligadura de trompas en la mujer Esto es una ciruga para obstruir las trompas de [place]. Esterilizacin histeroscpica Este es un procedimiento para Scientific laboratory technician un dispositivo en cada trompa de [place]. Este mtodo funciona al cabo de 3 meses. Se deben usar otros mtodos anticonceptivos durante 3 meses. Esterilizacin masculina Esta es una [place], llamada vasectoma, para ligar los conductos  que transportan los espermatozoides en los hombres. Este mtodo funciona al cabo de 3 meses. Se deben usar otros mtodos anticonceptivos durante 3 meses. Mtodos de planificacin natural Esto significa no tener [employer] International la pareja femenina podra quedar embarazada. A continuacin se mencionan algunos mtodos anticonceptivos por planificacin natural: Usar un calendario a fin de: Hacer un seguimiento de la duracin de cada ciclo menstrual. Determinar en [employer] se podra producir Firefighter. Planificar no tener [employer] en que se podra producir Firefighter. Reconocer los signos de la ovulacin y no tener relaciones sexuales durante ese perodo. La pareja femenina puede detectar cundo ser la ovulacin haciendo un seguimiento de su temperatura todos [place]. Tambin puede examinar si hay cambios en la mucosidad que proviene del cuello uterino. Dnde obtener ms informacin Centers for Disease Control and Prevention (Centros para el Control y la Prevencin de [place]): TonerPromos.no Esta informacin no tiene Theme park manager el consejo del mdico. Asegrese de hacerle al mdico cualquier pregunta que tenga. Document Revised: 04/03/2022 Document Reviewed: 04/03/2022 Elsevier Patient Education  2024 [employer].

## 2022-11-21 ENCOUNTER — Encounter: Payer: Self-pay | Admitting: Family Medicine

## 2022-11-21 DIAGNOSIS — O0990 Supervision of high risk pregnancy, unspecified, unspecified trimester: Secondary | ICD-10-CM

## 2022-11-24 ENCOUNTER — Encounter: Payer: Self-pay | Admitting: *Deleted

## 2022-11-24 ENCOUNTER — Ambulatory Visit: Payer: Medicaid Other | Admitting: *Deleted

## 2022-11-24 ENCOUNTER — Other Ambulatory Visit: Payer: Self-pay | Admitting: *Deleted

## 2022-11-24 ENCOUNTER — Ambulatory Visit: Payer: Medicaid Other | Attending: Family Medicine

## 2022-11-24 ENCOUNTER — Other Ambulatory Visit: Payer: Self-pay

## 2022-11-24 VITALS — BP 111/63 | HR 78

## 2022-11-24 DIAGNOSIS — O0990 Supervision of high risk pregnancy, unspecified, unspecified trimester: Secondary | ICD-10-CM | POA: Diagnosis present

## 2022-11-24 DIAGNOSIS — O09899 Supervision of other high risk pregnancies, unspecified trimester: Secondary | ICD-10-CM

## 2022-11-24 DIAGNOSIS — Z362 Encounter for other antenatal screening follow-up: Secondary | ICD-10-CM

## 2022-11-26 NOTE — Progress Notes (Unsigned)
   PRENATAL VISIT NOTE  Subjective:  Sherri [name] is a 32 y.o. Z6X0960 at [redacted]w[redacted]d being seen today for ongoing prenatal care.  She is currently monitored for the following issues for this low-risk pregnancy and has History of preterm delivery, currently pregnant; Language barrier; and Supervision of high risk pregnancy, antepartum on their problem list.  Patient reports {[id number]}.   .  .   . Denies leaking of fluid.   The following portions of the patient's history were reviewed and updated as appropriate: allergies, current medications, past family history, past medical history, past social history, past surgical history and problem list.   Objective:  There were no vitals filed for this visit.  Fetal Status:           General:  Alert, oriented and cooperative. Patient is in no acute distress.  Skin: Skin is warm and dry. No rash noted.   Cardiovascular: Normal heart rate noted  Respiratory: Normal respiratory effort, no problems with respiration noted  Abdomen: Soft, gravid, appropriate for gestational age.        Pelvic: Cervical exam deferred        Extremities: Normal range of motion.     Mental Status: Normal mood and affect. Normal behavior. Normal judgment and thought content.   Assessment and Plan:  Pregnancy: G3P1102 at [redacted]w[redacted]d 1. Supervision of high risk pregnancy, antepartum Afp today Anatomy incomplete. F/u scheduled fro 12/26.   2. Language barrier Spanish interpreter used throughout  3. History of preterm delivery, currently pregnant Delivered at [redacted]w[redacted]d  4. Pregnancy with 20 completed weeks gestation   Preterm labor symptoms and general obstetric precautions including but not limited to vaginal bleeding, contractions, leaking of fluid and fetal movement were reviewed in detail with the patient. Please refer to After Visit Summary for other counseling recommendations.   No follow-ups on file.  Future Appointments  Date Time Provider Department Center   11/27/2022  8:35 AM Milas Hock, MD [facility]  12/22/2022  8:15 AM Federico Flake, MD [facility]  01/01/2023 10:15 AM [facility] NURSE [facility]  01/01/2023 10:30 AM [facility] US4 [facility]    Milas Hock, MD

## 2022-11-27 ENCOUNTER — Other Ambulatory Visit: Payer: Self-pay

## 2022-11-27 ENCOUNTER — Ambulatory Visit (INDEPENDENT_AMBULATORY_CARE_PROVIDER_SITE_OTHER): Payer: Self-pay | Admitting: Obstetrics and Gynecology

## 2022-11-27 VITALS — BP 113/78 | HR 94 | Wt 154.0 lb

## 2022-11-27 DIAGNOSIS — Z758 Other problems related to medical facilities and other health care: Secondary | ICD-10-CM

## 2022-11-27 DIAGNOSIS — O0990 Supervision of high risk pregnancy, unspecified, unspecified trimester: Secondary | ICD-10-CM

## 2022-11-27 DIAGNOSIS — O09899 Supervision of other high risk pregnancies, unspecified trimester: Secondary | ICD-10-CM

## 2022-11-27 DIAGNOSIS — Z3A20 20 weeks gestation of pregnancy: Secondary | ICD-10-CM

## 2022-11-27 DIAGNOSIS — Z603 Acculturation difficulty: Secondary | ICD-10-CM

## 2022-12-22 ENCOUNTER — Other Ambulatory Visit: Payer: Self-pay

## 2022-12-22 ENCOUNTER — Ambulatory Visit (INDEPENDENT_AMBULATORY_CARE_PROVIDER_SITE_OTHER): Payer: Self-pay | Admitting: Family Medicine

## 2022-12-22 VITALS — BP 105/71 | HR 87 | Wt 155.5 lb

## 2022-12-22 DIAGNOSIS — Z758 Other problems related to medical facilities and other health care: Secondary | ICD-10-CM

## 2022-12-22 DIAGNOSIS — O0990 Supervision of high risk pregnancy, unspecified, unspecified trimester: Secondary | ICD-10-CM

## 2022-12-22 DIAGNOSIS — Z603 Acculturation difficulty: Secondary | ICD-10-CM

## 2022-12-22 DIAGNOSIS — O09212 Supervision of pregnancy with history of pre-term labor, second trimester: Secondary | ICD-10-CM

## 2022-12-22 DIAGNOSIS — Z3A23 23 weeks gestation of pregnancy: Secondary | ICD-10-CM

## 2022-12-22 DIAGNOSIS — O09899 Supervision of other high risk pregnancies, unspecified trimester: Secondary | ICD-10-CM

## 2022-12-22 NOTE — Progress Notes (Signed)
   PRENATAL VISIT NOTE  Subjective:  Sherri [name] is a 32 y.o. Z3G6440 at [redacted]w[redacted]d being seen today for ongoing prenatal care.  She is currently monitored for the following issues for this low-risk pregnancy and has History of preterm delivery, currently pregnant; Language barrier; and Supervision of high risk pregnancy, antepartum on their problem list.  Patient reports no complaints.  Contractions: Irritability. Vag. Bleeding: None.  Movement: Present. Denies leaking of fluid.   The following portions of the patient's history were reviewed and updated as appropriate: allergies, current medications, past family history, past medical history, past social history, past surgical history and problem list.   Objective:   Vitals:   12/22/22 0833  BP: 105/71  Pulse: 87  Weight: 155 lb 8 oz (70.5 kg)    Fetal Status: Fetal Heart Rate (bpm): 155   Movement: Present     General:  Alert, oriented and cooperative. Patient is in no acute distress.  Skin: Skin is warm and dry. No rash noted.   Cardiovascular: Normal heart rate noted  Respiratory: Normal respiratory effort, no problems with respiration noted  Abdomen: Soft, gravid, appropriate for gestational age.  Pain/Pressure: Present     Pelvic: Cervical exam deferred        Extremities: Normal range of motion.  Edema: Trace (feet)  Mental Status: Normal mood and affect. Normal behavior. Normal judgment and thought content.   Assessment and Plan:  Pregnancy: G3P1102 at [redacted]w[redacted]d 1. History of preterm delivery, currently pregnant (Primary) Delivery at 36 weeks  2. Language barrier Spanish   3. Supervision of high risk pregnancy, antepartum Up to date FH appropriate Reports contractions every 10 minutes on WEd and Thurs of last week. None since and denies bleeding, loss of fluid Discussed reasons to go to the hospital for PTL Next appt with do her GTT Wants to speak to pregnancy navigator today  Preterm labor symptoms and general  obstetric precautions including but not limited to vaginal bleeding, contractions, leaking of fluid and fetal movement were reviewed in detail with the patient. Please refer to After Visit Summary for other counseling recommendations.   Return in about 4 weeks (around 01/19/2023) for Routine prenatal care, MD or APP, 28 wk labs.  Future Appointments  Date Time Provider Department Center  01/01/2023 10:15 AM [facility] NURSE [facility]  01/01/2023 10:30 AM [facility] US4 [facility]    Federico Flake, MD

## 2023-01-01 ENCOUNTER — Other Ambulatory Visit: Payer: Self-pay | Admitting: *Deleted

## 2023-01-01 ENCOUNTER — Encounter: Payer: Self-pay | Admitting: *Deleted

## 2023-01-01 ENCOUNTER — Ambulatory Visit: Payer: Medicaid Other | Admitting: *Deleted

## 2023-01-01 ENCOUNTER — Other Ambulatory Visit: Payer: Self-pay

## 2023-01-01 ENCOUNTER — Ambulatory Visit: Payer: Medicaid Other | Attending: Maternal & Fetal Medicine

## 2023-01-01 VITALS — BP 113/68 | HR 102

## 2023-01-01 DIAGNOSIS — O0990 Supervision of high risk pregnancy, unspecified, unspecified trimester: Secondary | ICD-10-CM

## 2023-01-01 DIAGNOSIS — O09899 Supervision of other high risk pregnancies, unspecified trimester: Secondary | ICD-10-CM | POA: Insufficient documentation

## 2023-01-01 DIAGNOSIS — O352XX0 Maternal care for (suspected) hereditary disease in fetus, not applicable or unspecified: Secondary | ICD-10-CM | POA: Diagnosis not present

## 2023-01-01 DIAGNOSIS — O3662X0 Maternal care for excessive fetal growth, second trimester, not applicable or unspecified: Secondary | ICD-10-CM

## 2023-01-01 DIAGNOSIS — Z3A25 25 weeks gestation of pregnancy: Secondary | ICD-10-CM | POA: Diagnosis not present

## 2023-01-01 DIAGNOSIS — O09212 Supervision of pregnancy with history of pre-term labor, second trimester: Secondary | ICD-10-CM

## 2023-01-01 DIAGNOSIS — Z362 Encounter for other antenatal screening follow-up: Secondary | ICD-10-CM | POA: Insufficient documentation

## 2023-01-01 DIAGNOSIS — O3660X0 Maternal care for excessive fetal growth, unspecified trimester, not applicable or unspecified: Secondary | ICD-10-CM

## 2023-01-07 NOTE — L&D Delivery Note (Signed)
 OB/GYN Faculty Practice Delivery Note  Sherri [name] is a 33 y.o. G9F6213 s/p vag delivery at [redacted]w[redacted]d. She was admitted for IOL due to FGR.   ROM: 2h 59m with thick MSF fluid GBS Status: PCR neg Maximum Maternal Temperature: 98.4  Labor Progress: Sherri [name] was admitted on the evening of 3/21 for IOL due to FGR; she had cx ripening w a foley followed by Pit/AROM prior to progressing to complete (Pit off x 2h at time of delivery).  Delivery Date/Time: March 22nd, 2025 at 0851 Delivery: Called to room and patient was complete and pushing. Head delivered LOA. No nuchal cord present. Shoulder and body delivered in usual fashion. Infant with spontaneous cry, placed on mother's abdomen, dried and stimulated. Cord clamped x 2 after 1-minute delay, and cut by FOB. Cord blood drawn. Placenta delivered spontaneously with gentle cord traction. Fundus firm with massage and Pitocin. Labia, perineum, vagina, and cervix inspected and found to be intact.   Placenta: spont, intact; to L&D Complications: none Lacerations: none EBL: 194cc Analgesia: none  Postpartum Planning [x]  message to sent to schedule follow-up   Infant: girl  APGARs 8/9  2730g (6lb 0.3oz)  Arabella Merles, CNM  03/28/2023 9:07 AM

## 2023-01-19 ENCOUNTER — Ambulatory Visit: Payer: Medicaid Other | Admitting: Family Medicine

## 2023-01-19 ENCOUNTER — Other Ambulatory Visit: Payer: Self-pay

## 2023-01-19 VITALS — BP 103/72 | HR 99 | Wt 162.6 lb

## 2023-01-19 DIAGNOSIS — O365930 Maternal care for other known or suspected poor fetal growth, third trimester, not applicable or unspecified: Secondary | ICD-10-CM | POA: Insufficient documentation

## 2023-01-19 DIAGNOSIS — O3662X0 Maternal care for excessive fetal growth, second trimester, not applicable or unspecified: Secondary | ICD-10-CM

## 2023-01-19 DIAGNOSIS — Z3A27 27 weeks gestation of pregnancy: Secondary | ICD-10-CM | POA: Diagnosis not present

## 2023-01-19 DIAGNOSIS — Z758 Other problems related to medical facilities and other health care: Secondary | ICD-10-CM

## 2023-01-19 DIAGNOSIS — O3663X0 Maternal care for excessive fetal growth, third trimester, not applicable or unspecified: Secondary | ICD-10-CM

## 2023-01-19 DIAGNOSIS — Z603 Acculturation difficulty: Secondary | ICD-10-CM

## 2023-01-19 DIAGNOSIS — O09892 Supervision of other high risk pregnancies, second trimester: Secondary | ICD-10-CM

## 2023-01-19 DIAGNOSIS — O0990 Supervision of high risk pregnancy, unspecified, unspecified trimester: Secondary | ICD-10-CM

## 2023-01-19 DIAGNOSIS — O0992 Supervision of high risk pregnancy, unspecified, second trimester: Secondary | ICD-10-CM | POA: Diagnosis not present

## 2023-01-19 DIAGNOSIS — Z23 Encounter for immunization: Secondary | ICD-10-CM | POA: Diagnosis not present

## 2023-01-19 DIAGNOSIS — O3660X0 Maternal care for excessive fetal growth, unspecified trimester, not applicable or unspecified: Secondary | ICD-10-CM | POA: Insufficient documentation

## 2023-01-19 DIAGNOSIS — O09899 Supervision of other high risk pregnancies, unspecified trimester: Secondary | ICD-10-CM

## 2023-01-19 NOTE — Progress Notes (Signed)
   PRENATAL VISIT NOTE  Subjective:  Sherri [name] is a 33 y.o. H6E8897 at [redacted]w[redacted]d being seen today for ongoing prenatal care.  She is currently monitored for the following issues for this high-risk pregnancy and has History of preterm delivery, currently pregnant; Language barrier; Supervision of high risk pregnancy, antepartum; and Large for gestational age fetus affecting management of mother on their problem list.  Patient reports no complaints.  Contractions: Irritability. Vag. Bleeding: None.  Movement: Present. Denies leaking of fluid.   The following portions of the patient's history were reviewed and updated as appropriate: allergies, current medications, past family history, past medical history, past social history, past surgical history and problem list.   Objective:   Vitals:   01/19/23 0853  BP: 103/72  Pulse: 99  Weight: 162 lb 9.6 oz (73.8 kg)    Fetal Status: Fetal Heart Rate (bpm): 145 Fundal Height: 28 cm Movement: Present     General:  Alert, oriented and cooperative. Patient is in no acute distress.  Skin: Skin is warm and dry. No rash noted.   Cardiovascular: Normal heart rate noted  Respiratory: Normal respiratory effort, no problems with respiration noted  Abdomen: Soft, gravid, appropriate for gestational age.  Pain/Pressure: Absent     Pelvic: Cervical exam deferred        Extremities: Normal range of motion.  Edema: None  Mental Status: Normal mood and affect. Normal behavior. Normal judgment and thought content.   Assessment and Plan:  Pregnancy: G3P1102 at [redacted]w[redacted]d 1. Supervision of high risk pregnancy, antepartum (Primary) 28 week labs today - Tdap vaccine greater than or equal to 7yo IM  2. Language barrier Spanish interpreter: in person used  3. History of preterm delivery, currently pregnant No s/sx's of PTL  4. [redacted] weeks gestation of pregnancy   5. Excessive fetal growth affecting management of pregnancy in third trimester, single or  unspecified fetus Last EFW 93% - f/u scheduled, 2 hour today  Preterm labor symptoms and general obstetric precautions including but not limited to vaginal bleeding, contractions, leaking of fluid and fetal movement were reviewed in detail with the patient. Please refer to After Visit Summary for other counseling recommendations.   Return in 2 weeks (on 02/02/2023).  Future Appointments  Date Time Provider Department Center  02/05/2023  8:55 AM Eveline Lynwood MATSU, MD [facility]  02/05/2023 11:15 AM [facility] NURSE [facility]  02/05/2023 11:30 AM [facility] US1 [facility]    Glenys GORMAN Birk, MD

## 2023-01-20 LAB — CBC
Hematocrit: 33.8 % — ABNORMAL LOW (ref 34.0–46.6)
Hemoglobin: 11.2 g/dL (ref 11.1–15.9)
MCH: 29.3 pg (ref 26.6–33.0)
MCHC: 33.1 g/dL (ref 31.5–35.7)
MCV: 89 fL (ref 79–97)
Platelets: 248 10*3/uL (ref 150–450)
RBC: 3.82 x10E6/uL (ref 3.77–5.28)
RDW: 12.7 % (ref 11.7–15.4)
WBC: 6.8 10*3/uL (ref 3.4–10.8)

## 2023-01-20 LAB — HIV ANTIBODY (ROUTINE TESTING W REFLEX): HIV Screen 4th Generation wRfx: NONREACTIVE

## 2023-01-20 LAB — GLUCOSE TOLERANCE, 2 HOURS W/ 1HR
Glucose, 1 hour: 140 mg/dL (ref 70–179)
Glucose, 2 hour: 79 mg/dL (ref 70–152)
Glucose, Fasting: 79 mg/dL (ref 70–91)

## 2023-01-20 LAB — RPR: RPR Ser Ql: NONREACTIVE

## 2023-01-21 ENCOUNTER — Telehealth: Payer: Self-pay | Admitting: *Deleted

## 2023-01-21 NOTE — Telephone Encounter (Addendum)
-----   Message from Abner Ables sent at 01/20/2023 10:24 AM EST ----- Your labs are normal  1/15  1515  Called pt w/interpreter [facility] and informed her of normal lab test results.  She voiced understanding and had no questions.

## 2023-01-28 ENCOUNTER — Encounter: Payer: Self-pay | Admitting: *Deleted

## 2023-01-28 DIAGNOSIS — O09299 Supervision of pregnancy with other poor reproductive or obstetric history, unspecified trimester: Secondary | ICD-10-CM | POA: Insufficient documentation

## 2023-02-03 ENCOUNTER — Encounter: Payer: Medicaid Other | Admitting: Family Medicine

## 2023-02-05 ENCOUNTER — Other Ambulatory Visit: Payer: Self-pay | Admitting: *Deleted

## 2023-02-05 ENCOUNTER — Ambulatory Visit: Payer: Medicaid Other | Admitting: Obstetrics & Gynecology

## 2023-02-05 ENCOUNTER — Ambulatory Visit (HOSPITAL_BASED_OUTPATIENT_CLINIC_OR_DEPARTMENT_OTHER): Payer: Medicaid Other

## 2023-02-05 ENCOUNTER — Ambulatory Visit: Payer: Medicaid Other | Attending: Maternal & Fetal Medicine | Admitting: *Deleted

## 2023-02-05 ENCOUNTER — Other Ambulatory Visit: Payer: Self-pay

## 2023-02-05 VITALS — BP 105/70 | HR 87

## 2023-02-05 VITALS — BP 108/86 | HR 103 | Wt 160.0 lb

## 2023-02-05 DIAGNOSIS — O3663X0 Maternal care for excessive fetal growth, third trimester, not applicable or unspecified: Secondary | ICD-10-CM

## 2023-02-05 DIAGNOSIS — Z3A30 30 weeks gestation of pregnancy: Secondary | ICD-10-CM | POA: Diagnosis not present

## 2023-02-05 DIAGNOSIS — O09213 Supervision of pregnancy with history of pre-term labor, third trimester: Secondary | ICD-10-CM | POA: Diagnosis not present

## 2023-02-05 DIAGNOSIS — O352XX0 Maternal care for (suspected) hereditary disease in fetus, not applicable or unspecified: Secondary | ICD-10-CM | POA: Insufficient documentation

## 2023-02-05 DIAGNOSIS — O09899 Supervision of other high risk pregnancies, unspecified trimester: Secondary | ICD-10-CM

## 2023-02-05 DIAGNOSIS — O0990 Supervision of high risk pregnancy, unspecified, unspecified trimester: Secondary | ICD-10-CM

## 2023-02-05 DIAGNOSIS — O09893 Supervision of other high risk pregnancies, third trimester: Secondary | ICD-10-CM | POA: Insufficient documentation

## 2023-02-05 DIAGNOSIS — O09299 Supervision of pregnancy with other poor reproductive or obstetric history, unspecified trimester: Secondary | ICD-10-CM

## 2023-02-05 DIAGNOSIS — O3660X0 Maternal care for excessive fetal growth, unspecified trimester, not applicable or unspecified: Secondary | ICD-10-CM

## 2023-02-05 DIAGNOSIS — Z758 Other problems related to medical facilities and other health care: Secondary | ICD-10-CM

## 2023-02-05 DIAGNOSIS — Z603 Acculturation difficulty: Secondary | ICD-10-CM

## 2023-02-05 NOTE — Progress Notes (Signed)
   PRENATAL VISIT NOTE  Subjective:  Sherri [name] is a 33 y.o. F6O1308 at [redacted]w[redacted]d being seen today for ongoing prenatal care.  She is currently monitored for the following issues for this low-risk pregnancy and has History of preterm delivery, currently pregnant; Language barrier; Supervision of high risk pregnancy, antepartum; Large for gestational age fetus affecting management of mother; and History of fetal abnormality in previous pregnancy, currently pregnant on their problem list.  Patient reports  pelvic/vaginal pressure and pain especially when walking .  Contractions: Not present. Vag. Bleeding: None.  Movement: Present. Denies leaking of fluid.   The following portions of the patient's history were reviewed and updated as appropriate: allergies, current medications, past family history, past medical history, past social history, past surgical history and problem list.   Objective:   Vitals:   02/05/23 0938  BP: 108/86  Pulse: (!) 103  Weight: 160 lb (72.6 kg)    Fetal Status: Fetal Heart Rate (bpm): 135   Movement: Present     General:  Alert, oriented and cooperative. Patient is in no acute distress.  Skin: Skin is warm and dry. No rash noted.   Cardiovascular: Normal heart rate noted  Respiratory: Normal respiratory effort, no problems with respiration noted  Abdomen: Soft, gravid, appropriate for gestational age.  Pain/Pressure: Present     Pelvic: Cervical exam deferred        Extremities: Normal range of motion.  Edema: None  Mental Status: Normal mood and affect. Normal behavior. Normal judgment and thought content.   Assessment and Plan:  Pregnancy: G3P1102 at [redacted]w[redacted]d 1. [redacted] weeks gestation of pregnancy (Primary) F/u growth  2. History of preterm delivery, currently pregnant   3. Supervision of high risk pregnancy, antepartum   4. Language barrier Spanish interpreter  5. Excessive fetal growth affecting management of pregnancy in third trimester, single  or unspecified fetus   Preterm labor symptoms and general obstetric precautions including but not limited to vaginal bleeding, contractions, leaking of fluid and fetal movement were reviewed in detail with the patient. Please refer to After Visit Summary for other counseling recommendations.   Return in about 2 weeks (around 02/19/2023).  Future Appointments  Date Time Provider Department Center  02/05/2023 11:15 AM [facility] NURSE [facility]  02/05/2023 11:30 AM [facility] US1 [facility]  02/27/2023  8:55 AM Reva Bores, MD [facility]  03/06/2023  8:55 AM Warden Fillers, MD [facility]    Scheryl Darter, MD

## 2023-02-20 ENCOUNTER — Ambulatory Visit: Payer: Medicaid Other | Admitting: Family Medicine

## 2023-02-20 VITALS — BP 102/75 | HR 81 | Wt 160.9 lb

## 2023-02-20 DIAGNOSIS — O09893 Supervision of other high risk pregnancies, third trimester: Secondary | ICD-10-CM

## 2023-02-20 DIAGNOSIS — O09899 Supervision of other high risk pregnancies, unspecified trimester: Secondary | ICD-10-CM

## 2023-02-20 DIAGNOSIS — O3663X0 Maternal care for excessive fetal growth, third trimester, not applicable or unspecified: Secondary | ICD-10-CM | POA: Diagnosis not present

## 2023-02-20 DIAGNOSIS — O0993 Supervision of high risk pregnancy, unspecified, third trimester: Secondary | ICD-10-CM

## 2023-02-20 DIAGNOSIS — Z603 Acculturation difficulty: Secondary | ICD-10-CM | POA: Diagnosis not present

## 2023-02-20 DIAGNOSIS — O0990 Supervision of high risk pregnancy, unspecified, unspecified trimester: Secondary | ICD-10-CM

## 2023-02-20 DIAGNOSIS — Z3A32 32 weeks gestation of pregnancy: Secondary | ICD-10-CM

## 2023-02-20 DIAGNOSIS — Z758 Other problems related to medical facilities and other health care: Secondary | ICD-10-CM

## 2023-02-20 NOTE — Progress Notes (Signed)
   PRENATAL VISIT NOTE  Subjective:  Sherri [name] is a 33 y.o. Z6X0960 at [redacted]w[redacted]d being seen today for ongoing prenatal care.  She is currently monitored for the following issues for this high-risk pregnancy and has History of preterm delivery, currently pregnant; Language barrier; Supervision of high risk pregnancy, antepartum; Large for gestational age fetus affecting management of mother (boarderline); and History of fetal abnormality in previous pregnancy, currently pregnant on their problem list.  Patient reports no complaints.  Contractions: Irregular. Vag. Bleeding: None.  Movement: Present. Denies leaking of fluid.   The following portions of the patient's history were reviewed and updated as appropriate: allergies, current medications, past family history, past medical history, past social history, past surgical history and problem list.   Objective:   Vitals:   02/20/23 0924  BP: 102/75  Pulse: 81  Weight: 160 lb 14.4 oz (73 kg)    Fetal Status: Fetal Heart Rate (bpm): 140 Fundal Height: 31 cm Movement: Present     General:  Alert, oriented and cooperative. Patient is in no acute distress.  Skin: Skin is warm and dry. No rash noted.   Cardiovascular: Normal heart rate noted  Respiratory: Normal respiratory effort, no problems with respiration noted  Abdomen: Soft, gravid, appropriate for gestational age.  Pain/Pressure: Present     Pelvic: Cervical exam deferred        Extremities: Normal range of motion.  Edema: None  Mental Status: Normal mood and affect. Normal behavior. Normal judgment and thought content.   Assessment and Plan:  Pregnancy: G3P1102 at [redacted]w[redacted]d 1. Supervision of high risk pregnancy, antepartum (Primary) Continue routine prenatal care.  2. Excessive fetal growth affecting management of pregnancy in third trimester, single or unspecified fetus Resolved, last EFW @ 69%  3. Language barrier Spanish interpreter: in person used  4. History of  preterm delivery, currently pregnant Followed by term delivery  5. [redacted] weeks gestation of pregnancy   Preterm labor symptoms and general obstetric precautions including but not limited to vaginal bleeding, contractions, leaking of fluid and fetal movement were reviewed in detail with the patient. Please refer to After Visit Summary for other counseling recommendations.   Return in 2 weeks (on 03/06/2023).  Future Appointments  Date Time Provider Department Center  02/27/2023  8:55 AM Reva Bores, MD [facility]  03/06/2023  8:55 AM Warden Fillers, MD [facility]  03/26/2023 11:30 AM [facility] US2 [facility]    Reva Bores, MD

## 2023-02-27 ENCOUNTER — Encounter: Payer: Medicaid Other | Admitting: Family Medicine

## 2023-03-06 ENCOUNTER — Ambulatory Visit: Payer: Medicaid Other | Admitting: Obstetrics and Gynecology

## 2023-03-06 ENCOUNTER — Other Ambulatory Visit: Payer: Self-pay

## 2023-03-06 VITALS — BP 110/73 | HR 93 | Wt 166.2 lb

## 2023-03-06 DIAGNOSIS — Z3A34 34 weeks gestation of pregnancy: Secondary | ICD-10-CM

## 2023-03-06 DIAGNOSIS — O09299 Supervision of pregnancy with other poor reproductive or obstetric history, unspecified trimester: Secondary | ICD-10-CM

## 2023-03-06 DIAGNOSIS — O0990 Supervision of high risk pregnancy, unspecified, unspecified trimester: Secondary | ICD-10-CM

## 2023-03-06 DIAGNOSIS — O3663X0 Maternal care for excessive fetal growth, third trimester, not applicable or unspecified: Secondary | ICD-10-CM

## 2023-03-06 DIAGNOSIS — O09899 Supervision of other high risk pregnancies, unspecified trimester: Secondary | ICD-10-CM

## 2023-03-06 DIAGNOSIS — Z603 Acculturation difficulty: Secondary | ICD-10-CM | POA: Diagnosis not present

## 2023-03-06 DIAGNOSIS — O0993 Supervision of high risk pregnancy, unspecified, third trimester: Secondary | ICD-10-CM

## 2023-03-06 DIAGNOSIS — O09893 Supervision of other high risk pregnancies, third trimester: Secondary | ICD-10-CM | POA: Diagnosis not present

## 2023-03-06 DIAGNOSIS — O09293 Supervision of pregnancy with other poor reproductive or obstetric history, third trimester: Secondary | ICD-10-CM | POA: Diagnosis not present

## 2023-03-06 DIAGNOSIS — Z758 Other problems related to medical facilities and other health care: Secondary | ICD-10-CM

## 2023-03-06 NOTE — Progress Notes (Signed)
   PRENATAL VISIT NOTE  Subjective:  Sherri [name] is a 33 y.o. Z6X0960 at [redacted]w[redacted]d being seen today for ongoing prenatal care.  She is currently monitored for the following issues for this low-risk pregnancy and has History of preterm delivery, currently pregnant; Language barrier; Supervision of high risk pregnancy, antepartum; Large for gestational age fetus affecting management of mother (boarderline); and History of fetal abnormality in previous pregnancy, currently pregnant on their problem list.  Patient doing well with no acute concerns today. She reports  mild vaginal soreness .  Contractions: Irritability. Vag. Bleeding: None.  Movement: Present. Denies leaking of fluid.   The following portions of the patient's history were reviewed and updated as appropriate: allergies, current medications, past family history, past medical history, past social history, past surgical history and problem list. Problem list updated.  Objective:   Vitals:   03/06/23 0900  BP: 110/73  Pulse: 93  Weight: 166 lb 3.2 oz (75.4 kg)    Fetal Status: Fetal Heart Rate (bpm): 142 Fundal Height: 34 cm Movement: Present     General:  Alert, oriented and cooperative. Patient is in no acute distress.  Skin: Skin is warm and dry. No rash noted.   Cardiovascular: Normal heart rate noted  Respiratory: Normal respiratory effort, no problems with respiration noted  Abdomen: Soft, gravid, appropriate for gestational age.  Pain/Pressure: Present     Pelvic: Cervical exam deferred        Extremities: Normal range of motion.  Edema: Trace  Mental Status:  Normal mood and affect. Normal behavior. Normal judgment and thought content.   Assessment and Plan:  Pregnancy: G3P1102 at [redacted]w[redacted]d  1. [redacted] weeks gestation of pregnancy (Primary)   2. History of fetal abnormality in previous pregnancy, currently pregnant Previous anatomy scan WNL  3. History of preterm delivery, currently pregnant No s/sx of preterm  labor  4. Language barrier Live interpreter present  5. Excessive fetal growth affecting management of pregnancy in third trimester, single or unspecified fetus Last EFW at 69%, pt has repeat growth scan at 37 weeks  6. Supervision of high risk pregnancy, antepartum Continue routine prenatal care  Preterm labor symptoms and general obstetric precautions including but not limited to vaginal bleeding, contractions, leaking of fluid and fetal movement were reviewed in detail with the patient.  Please refer to After Visit Summary for other counseling recommendations.   Return in about 2 weeks (around 03/20/2023) for ROB, in person, 36 weeks swabs.   Mariel Aloe, MD Faculty Attending Center for [facility]

## 2023-03-24 ENCOUNTER — Encounter: Payer: Self-pay | Admitting: Obstetrics and Gynecology

## 2023-03-24 ENCOUNTER — Other Ambulatory Visit (HOSPITAL_COMMUNITY)
Admission: RE | Admit: 2023-03-24 | Discharge: 2023-03-24 | Disposition: A | Discharge status: Home / Self Care | Source: Ambulatory Visit | Attending: Obstetrics and Gynecology | Admitting: Obstetrics and Gynecology

## 2023-03-24 ENCOUNTER — Ambulatory Visit (INDEPENDENT_AMBULATORY_CARE_PROVIDER_SITE_OTHER): Payer: Medicaid Other | Admitting: Obstetrics and Gynecology

## 2023-03-24 ENCOUNTER — Other Ambulatory Visit: Payer: Self-pay

## 2023-03-24 VITALS — BP 110/75 | HR 90 | Wt 169.0 lb

## 2023-03-24 DIAGNOSIS — O3663X0 Maternal care for excessive fetal growth, third trimester, not applicable or unspecified: Secondary | ICD-10-CM

## 2023-03-24 DIAGNOSIS — O09893 Supervision of other high risk pregnancies, third trimester: Secondary | ICD-10-CM

## 2023-03-24 DIAGNOSIS — O0993 Supervision of high risk pregnancy, unspecified, third trimester: Secondary | ICD-10-CM

## 2023-03-24 DIAGNOSIS — O0990 Supervision of high risk pregnancy, unspecified, unspecified trimester: Secondary | ICD-10-CM

## 2023-03-24 DIAGNOSIS — O09293 Supervision of pregnancy with other poor reproductive or obstetric history, third trimester: Secondary | ICD-10-CM

## 2023-03-24 DIAGNOSIS — Z758 Other problems related to medical facilities and other health care: Secondary | ICD-10-CM

## 2023-03-24 DIAGNOSIS — Z603 Acculturation difficulty: Secondary | ICD-10-CM | POA: Diagnosis not present

## 2023-03-24 DIAGNOSIS — O3660X0 Maternal care for excessive fetal growth, unspecified trimester, not applicable or unspecified: Secondary | ICD-10-CM

## 2023-03-24 DIAGNOSIS — O09299 Supervision of pregnancy with other poor reproductive or obstetric history, unspecified trimester: Secondary | ICD-10-CM

## 2023-03-24 DIAGNOSIS — Z3A36 36 weeks gestation of pregnancy: Secondary | ICD-10-CM

## 2023-03-24 DIAGNOSIS — O09899 Supervision of other high risk pregnancies, unspecified trimester: Secondary | ICD-10-CM

## 2023-03-24 NOTE — Progress Notes (Signed)
   PRENATAL VISIT NOTE  Subjective:  Sherri [name] is a 33 y.o. H8I6962 at [redacted]w[redacted]d being seen today for ongoing prenatal care.  She is currently monitored for the following issues for this high-risk pregnancy and has History of preterm delivery, currently pregnant; Language barrier; Supervision of high risk pregnancy, antepartum; Large for gestational age fetus affecting management of mother (boarderline); and History of fetal abnormality in previous pregnancy, currently pregnant on their problem list.  Patient reports  pelvic pressure .  Contractions: Irritability. Vag. Bleeding: None.  Movement: Present. Denies leaking of fluid.   The following portions of the patient's history were reviewed and updated as appropriate: allergies, current medications, past family history, past medical history, past social history, past surgical history and problem list.   Objective:   Vitals:   03/24/23 1638  BP: 110/75  Pulse: 90  Weight: 169 lb (76.7 kg)    Fetal Status: Fetal Heart Rate (bpm): 140   Movement: Present     General:  Alert, oriented and cooperative. Patient is in no acute distress.  Skin: Skin is warm and dry. No rash noted.   Cardiovascular: Normal heart rate noted  Respiratory: Normal respiratory effort, no problems with respiration noted  Abdomen: Soft, gravid, appropriate for gestational age.  Pain/Pressure: Present (Pressure and pain ABD)     Pelvic: Cervical exam deferred        Extremities: Normal range of motion.  Edema: Trace  Mental Status: Normal mood and affect. Normal behavior. Normal judgment and thought content.   Assessment and Plan:  Pregnancy: G3P1102 at [redacted]w[redacted]d  1. History of preterm delivery, currently pregnant (Primary) Delivered at [redacted]w[redacted]d  2. Language barrier Spanish interpretor used  3. Supervision of high risk pregnancy, antepartum  4. Excessive fetal growth affecting management of pregnancy, antepartum, single or unspecified fetus Anatomy 93%,  last growth was 69%tile, has f/u scheduled 03/26/23  5. History of fetal abnormality in previous pregnancy, currently pregnant  6. [redacted] weeks gestation of pregnancy Swabs today  Preterm labor symptoms and general obstetric precautions including but not limited to vaginal bleeding, contractions, leaking of fluid and fetal movement were reviewed in detail with the patient. Please refer to After Visit Summary for other counseling recommendations.   Return in about 1 week (around 03/31/2023) for high OB.  Future Appointments  Date Time Provider Department Center  03/26/2023 11:30 AM [facility] US2 [facility]  03/31/2023  3:55 PM [facility]  04/08/2023  2:15 PM Federico Flake, MD [facility]  04/14/2023  3:15 PM [place] Bing, MD [facility]  04/21/2023  3:55 PM Conan Bowens, MD [facility]    Conan Bowens, MD

## 2023-03-26 ENCOUNTER — Other Ambulatory Visit: Payer: Self-pay

## 2023-03-26 ENCOUNTER — Inpatient Hospital Stay (HOSPITAL_COMMUNITY)
Admission: AD | Admit: 2023-03-26 | Discharge: 2023-03-26 | Disposition: A | Discharge status: Home / Self Care | Source: Home / Self Care | Attending: Family Medicine | Admitting: Family Medicine

## 2023-03-26 ENCOUNTER — Other Ambulatory Visit: Payer: Self-pay | Admitting: Maternal & Fetal Medicine

## 2023-03-26 ENCOUNTER — Ambulatory Visit: Attending: Obstetrics and Gynecology | Admitting: Maternal & Fetal Medicine

## 2023-03-26 ENCOUNTER — Ambulatory Visit: Payer: Medicaid Other

## 2023-03-26 ENCOUNTER — Encounter (HOSPITAL_COMMUNITY): Payer: Self-pay | Admitting: Family Medicine

## 2023-03-26 DIAGNOSIS — Z0371 Encounter for suspected problem with amniotic cavity and membrane ruled out: Secondary | ICD-10-CM

## 2023-03-26 DIAGNOSIS — Z3A37 37 weeks gestation of pregnancy: Secondary | ICD-10-CM

## 2023-03-26 DIAGNOSIS — O09899 Supervision of other high risk pregnancies, unspecified trimester: Secondary | ICD-10-CM | POA: Insufficient documentation

## 2023-03-26 DIAGNOSIS — O365930 Maternal care for other known or suspected poor fetal growth, third trimester, not applicable or unspecified: Secondary | ICD-10-CM

## 2023-03-26 DIAGNOSIS — O09299 Supervision of pregnancy with other poor reproductive or obstetric history, unspecified trimester: Secondary | ICD-10-CM

## 2023-03-26 DIAGNOSIS — O365990 Maternal care for other known or suspected poor fetal growth, unspecified trimester, not applicable or unspecified: Secondary | ICD-10-CM

## 2023-03-26 DIAGNOSIS — O09292 Supervision of pregnancy with other poor reproductive or obstetric history, second trimester: Secondary | ICD-10-CM | POA: Diagnosis not present

## 2023-03-26 DIAGNOSIS — O352XX0 Maternal care for (suspected) hereditary disease in fetus, not applicable or unspecified: Secondary | ICD-10-CM

## 2023-03-26 DIAGNOSIS — O471 False labor at or after 37 completed weeks of gestation: Secondary | ICD-10-CM

## 2023-03-26 LAB — CERVICOVAGINAL ANCILLARY ONLY
Chlamydia: NEGATIVE
Comment: NEGATIVE
Comment: NORMAL
Neisseria Gonorrhea: NEGATIVE

## 2023-03-26 LAB — POCT FERN TEST: POCT Fern Test: NEGATIVE

## 2023-03-26 LAB — RUPTURE OF MEMBRANE (ROM)PLUS: Rom Plus: NEGATIVE

## 2023-03-26 NOTE — MAU Note (Signed)
 Sherri [name] is a 33 y.o. at [redacted]w[redacted]d here in MAU reporting: states she's been leaking a clear fluid and urinating more often and unsure if water is broke.  States had ultrasound today and informed there's been a decrease in fluid.  Also reports decreased FM, baby moving less than usual.  Denies VB   LMP: NA Onset of complaint: today Pain score: 1    FHT: 155 bpm  Lab orders placed from triage: None   See VS in chart

## 2023-03-26 NOTE — MAU Provider Note (Signed)
 Chief Complaint:  Rupture of Membranes and Decreased Fetal Movement   None     HPI: Sherri [name] is a 33 y.o. [id number] at [redacted]w[redacted]d who presents to maternity admissions sent from MFM for clear fluid seen when she went to the bathroom. Pt was not sure if it was urine or leaking fluid.  There has been no additional leaking after that episode.  She was told her baby measured small on the ultrasound today.  She reports good fetal movement and denies vaginal bleeding and contractions.   HPI  Past Medical History: Past Medical History:  Diagnosis Date   Abscess of breast, antepartum    not during pregnancy or breast feeeding drainged and antibiotics resolved no surgery   Medical history non-contributory    Preterm labor     Past obstetric history: OB History  Gravida Para Term Preterm AB Living  3 2 1 1  0 2  SAB IAB Ectopic Multiple Live Births  0 0 0 0 2    # Outcome Date GA Lbr Len/2nd Weight Sex Type Anes PTL Lv  3 Current           2 Term 02/22/21 [redacted]w[redacted]d  3374 g F Vag-Spont EPI  LIV  1 Preterm 07/11/15 [redacted]w[redacted]d 08:37 / 00:17 2682 g M Vag-Spont EPI  LIV     Birth Comments: left ring and pinky finger fused together , PROM    Past Surgical History: Past Surgical History:  Procedure Laterality Date   NO PAST SURGERIES      Family History: Family History  Problem Relation Age of Onset   Diabetes Father    Diabetes Paternal Grandmother     Social History: Social History   Tobacco Use   Smoking status: Never   Smokeless tobacco: Never  Vaping Use   Vaping status: Never Used  Substance Use Topics   Alcohol use: No   Drug use: No    Allergies:  Allergies  Allergen Reactions   Pork-Derived Products Hives   Penicillins Rash    Has patient had a PCN reaction causing immediate rash, facial/tongue/throat swelling, SOB or lightheadedness with hypotension: No Has patient had a PCN reaction causing severe rash involving mucus membranes or skin necrosis: No Has patient  had a PCN reaction that required hospitalization No Has patient had a PCN reaction occurring within the last 10 years: No If all of the above answers are "NO", then may proceed with Cephalosporin use.     Meds:  Medications Prior to Admission  Medication Sig Dispense Refill Last Dose/Taking   Prenatal Vit-Fe Fumarate-FA (MULTIVITAMIN-PRENATAL) 27-0.8 MG TABS tablet Take 1 tablet by mouth daily at 12 noon.       ROS:  Review of Systems   I have reviewed patient's Past Medical Hx, Surgical Hx, Family Hx, Social Hx, medications and allergies.   Physical Exam  Patient Vitals for the past 24 hrs:  BP Temp Temp src Pulse Resp SpO2 Height Weight  03/26/23 1845 119/83 -- -- 84 -- 100 % -- --  03/26/23 1824 116/86 98.1 F (36.7 C) Oral (!) 58 20 100 % 5' (1.524 m) 75.3 kg   Constitutional: Well-developed, well-nourished female in no acute distress.  Cardiovascular: normal rate Respiratory: normal effort GI: Abd soft, non-tender, gravid appropriate for gestational age.  MS: Extremities nontender, no edema, normal ROM Neurologic: Alert and oriented x 4.  GU: Neg CVAT.  PELVIC EXAM: Cervix pink, visually closed, without lesion, scant white creamy discharge, vaginal walls and external genitalia  normal Bimanual exam: Cervix 0/long/high, firm, anterior, neg CMT, uterus nontender, nonenlarged, adnexa without tenderness, enlargement, or mass     FHT:  Baseline 135 , moderate variability, accelerations present, no decelerations Contractions: q 5-7 mins, mild to palpation   Labs: Results for orders placed or performed during the hospital encounter of 03/26/23 (from the past 24 hours)  Rupture of Membrane (ROM) Plus     Status: None   Collection Time: 03/26/23  8:12 PM  Result Value Ref Range   Rom Plus NEGATIVE   Fern Test     Status: Normal   Collection Time: 03/26/23  8:20 PM  Result Value Ref Range   POCT Fern Test Negative = intact amniotic membranes    O/Positive/-- (09/25  1023)  Imaging:  [place] MFM OB FOLLOW UP Result Date: 03/26/2023 ----------------------------------------------------------------------  OBSTETRICS REPORT                       (Signed Final 03/26/2023 01:22 pm) ---------------------------------------------------------------------- Patient Info  ID #:       [id number]                          D.O.B.:  1990/11/13 (32 yrs)(F)  Name:       [name]            Visit Date: 03/26/2023 11:39 am ---------------------------------------------------------------------- Performed By  Attending:        Braxton Feathers DO       Ref. Address:     [address]  Performed By:     Dennis Bast RDMS      [facility]  Referred By:      Venora Maples MD ---------------------------------------------------------------------- Orders  #  Description                           Code        Ordered By  1  [place] MFM OB FOLLOW UP                   33295.18    BURK SCHAIBLE  2  [place] MFM UA CORD DOPPLER  32202.54    BURK SCHAIBLE  3  [place] MFM FETAL BPP WO NON               76819.01    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 Cord Insertion:      Previously seen  Amniotic Fluid  AFI FV:      Within normal limits  AFI Sum(cm)     %Tile       Largest Pocket(cm)  7.74            7           4.05  RUQ(cm)       RLQ(cm)       LUQ(cm)        LLQ(cm)  0             4.05          1.1            2.59 ---------------------------------------------------------------------- Biophysical Evaluation  Amniotic F.V:   Within normal limits       F. Tone:        Observed  F. Movement:    Observed                   Score:          8/8  F. Breathing:   Observed ---------------------------------------------------------------------- Biometry  BPD:      84.2  mm     G. Age:  33w 6d        2.6  %    CI:        77.35   %    70 -  86                                                          FL/HC:      22.1   %    20.8 - 22.6  HC:      303.1  mm     G. Age:  33w 5d        < 1  %    HC/AC:      1.00        0.92 - 1.05  AC:      304.5  mm     G. Age:  34w 3d          5  %    FL/BPD:     79.7   %    71 - 87  FL:       67.1  mm     G. Age:  34w 4d          4  %    FL/AC:      22.0   %    20 - 24  Est. FW:    2398  gm      5 lb 5 oz      5  % ---------------------------------------------------------------------- OB History  Gravidity:    3         Term:   1        Prem:   1  Living:       2 ---------------------------------------------------------------------- Gestational Age  LMP:           37w 0d        Date:  07/10/22                   EDD:   04/16/23  U/S Today:     34w 1d                                        EDD:   05/06/23  Best:          37w 0d      Det. By:  LMP  (07/10/22)          EDD:   04/16/23 ---------------------------------------------------------------------- Anatomy  Cranium:               Previously seen        Aortic Arch:            Previously seen  Cavum:                 Previously seen        Ductal Arch:            Previously seen  Ventricles:            Appears normal         Diaphragm:              Appears normal  Choroid Plexus:        Previously seen        Stomach:                Appears normal, left  sided  Cerebellum:            Previously seen        Abdomen:                Previously seen  Posterior Fossa:       Previously seen        Abdominal Wall:         Previously seen  Face:                  Orbits and profile     Cord Vessels:           Previously seen                         previously seen  Lips:                  Previously seen        Kidneys:                Appear normal  Thoracic:              Previously seen        Bladder:                Appears normal  Heart:                 Appears normal         Spine:                  Previously seen                         (4CH, axis, and                         situs)  RVOT:                  Previously seen        Upper Extremities:      Previously seen  LVOT:                  Previously seen        Lower Extremities:      Previously seen ---------------------------------------------------------------------- Doppler - Fetal Vessels  Umbilical Artery   S/D     %tile      RI    %tile                      PSV    ADFV    RDFV                                                     (cm/s)   2.16       37    0.54       40                     49.48      No      No ---------------------------------------------------------------------- Cervix Uterus Adnexa  Cervix  Not visualized (advanced GA >24wks)  Uterus  No abnormality visualized.  Right Ovary  Not visualized.  Left Ovary  Not visualized.  Cul De Sac  No free fluid  seen.   Adnexa  No abnormality visualized ---------------------------------------------------------------------- Comments  The patient is here for a follow-up ultrasound for FGR  at 37w  0d. EDD: 04/16/2023. Dating: LMP  (07/10/22). She belives  she is losing amniotic fluid. I discussed my concern for  decreasing fetal growth percentiles and the need for possible  delivery if is diagnosed with ROM. I discussed the concern for  placental insufficiency and stillbirth with FGR.  Sonographic findings  Single intrauterine pregnancy.  Fetal cardiac activity:  Observed and appears normal.  Presentation: Cephalic.  Interval fetal anatomy appears normal.  Fetal biometry shows the estimated fetal weight at the 5  percentile and the abdominal circumference at the 5th  percentile.  Amniotic fluid: Within normal limits.  MVP: 4.05 cm.  Placenta: Anterior.  Umbilical artery dopplers findings:  -S/D:2.16 which are normal at this gestational age.  -Absent end-diastolic flow: No.  -Reversed end-diastolic flow:  No.  BPP 8/8.  Recommendations  - Sent to MAU for r/o ROM. If all testing for ROM is negative  deliery should occur in 2-3 days due to a large drop in the  estmiated weight, now FGR. If ROM is positive she should be  admitted for delivery.  There are limitations of prenatal ultrasound such as the  inability to detect certain abnormalities due to poor  visualization. Various factors such as fetal position,  gestational age and maternal body habitus may increase the  difficulty in visualizing the fetal anatomy. ----------------------------------------------------------------------                  Braxton Feathers, DO Electronically Signed Final Report   03/26/2023 01:22 pm ----------------------------------------------------------------------   [place] MFM UA CORD DOPPLER Result Date: 03/26/2023 ----------------------------------------------------------------------  OBSTETRICS REPORT                       (Signed Final 03/26/2023 01:22 pm)  ---------------------------------------------------------------------- Patient Info  ID #:       [id number]                          D.O.B.:  06-Dec-1990 (32 yrs)(F)  Name:       [name]            Visit Date: 03/26/2023 11:39 am ---------------------------------------------------------------------- Performed By  Attending:        Braxton Feathers DO       Ref. Address:     [address]  Performed By:     Dennis Bast RDMS      [facility]  Referred By:      Venora Maples MD ---------------------------------------------------------------------- Orders  #  Description                           Code        Ordered By  1  [place] MFM OB FOLLOW UP                   32440.10    Braxton Feathers  2  [place] MFM UA CORD DOPPLER                76820.02    BURK SCHAIBLE  3  [place] MFM FETAL BPP WO NON               76819.01    [facility]     STRESS ----------------------------------------------------------------------  #  Order #                     Accession #                Episode #  1  [id number]                   [id number]                 [id number]  2  [id number]                   [id number]                 [id number]  3  [id number]                   [id number]                 [id number] ---------------------------------------------------------------------- Indications  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 Cord Insertion:      Previously seen  Amniotic Fluid  AFI FV:      Within normal limits  AFI Sum(cm)     %Tile       Largest Pocket(cm)  7.74            7           4.05  RUQ(cm)       RLQ(cm)       LUQ(cm)        LLQ(cm)  0             4.05          1.1  2.59 ---------------------------------------------------------------------- Biophysical Evaluation  Amniotic F.V:   Within normal limits       F. Tone:        Observed  F. Movement:    Observed                   Score:          8/8  F. Breathing:   Observed ---------------------------------------------------------------------- Biometry  BPD:      84.2  mm     G. Age:  33w 6d        2.6  %    CI:        77.35   %    70 - 86                                                          FL/HC:      22.1   %    20.8 - 22.6  HC:      303.1  mm     G. Age:  33w 5d        < 1  %    HC/AC:      1.00        0.92 - 1.05  AC:      304.5  mm     G. Age:  34w 3d          5  %    FL/BPD:     79.7   %    71 - 87  FL:       67.1  mm     G. Age:  34w 4d          4  %    FL/AC:      22.0   %    20 - 24  Est. FW:    2398  gm      5 lb 5 oz      5  % ---------------------------------------------------------------------- OB History  Gravidity:    3         Term:   1        Prem:   1  Living:       2 ---------------------------------------------------------------------- Gestational Age  LMP:           37w 0d        Date:  07/10/22                   EDD:   04/16/23  U/S Today:     34w 1d                                        EDD:   05/06/23  Best:          37w 0d     Det. By:  LMP  (07/10/22)          EDD:   04/16/23 ---------------------------------------------------------------------- Anatomy  Cranium:               Previously seen         Aortic Arch:            Previously seen  Cavum:  Previously seen        Ductal Arch:            Previously seen  Ventricles:            Appears normal         Diaphragm:              Appears normal  Choroid Plexus:        Previously seen        Stomach:                Appears normal, left                                                                        sided  Cerebellum:            Previously seen        Abdomen:                Previously seen  Posterior Fossa:       Previously seen        Abdominal Wall:         Previously seen  Face:                  Orbits and profile     Cord Vessels:           Previously seen                         previously seen  Lips:                  Previously seen        Kidneys:                Appear normal  Thoracic:              Previously seen        Bladder:                Appears normal  Heart:                 Appears normal         Spine:                  Previously seen                         (4CH, axis, and                         situs)  RVOT:                  Previously seen        Upper Extremities:      Previously seen  LVOT:                  Previously seen        Lower Extremities:      Previously seen ---------------------------------------------------------------------- Doppler - Fetal Vessels  Umbilical Artery   S/D     %tile      RI    %tile  PSV    ADFV    RDFV                                                     (cm/s)   2.16       37    0.54       40                     49.48      No      No ---------------------------------------------------------------------- Cervix Uterus Adnexa  Cervix  Not visualized (advanced GA >24wks)  Uterus  No abnormality visualized.  Right Ovary  Not visualized.  Left Ovary  Not visualized.  Cul De Sac  No free fluid seen.  Adnexa  No abnormality visualized ---------------------------------------------------------------------- Comments  The patient is here for a follow-up ultrasound for FGR  at  37w  0d. EDD: 04/16/2023. Dating: LMP  (07/10/22). She belives  she is losing amniotic fluid. I discussed my concern for  decreasing fetal growth percentiles and the need for possible  delivery if is diagnosed with ROM. I discussed the concern for  placental insufficiency and stillbirth with FGR.  Sonographic findings  Single intrauterine pregnancy.  Fetal cardiac activity:  Observed and appears normal.  Presentation: Cephalic.  Interval fetal anatomy appears normal.  Fetal biometry shows the estimated fetal weight at the 5  percentile and the abdominal circumference at the 5th  percentile.  Amniotic fluid: Within normal limits.  MVP: 4.05 cm.  Placenta: Anterior.  Umbilical artery dopplers findings:  -S/D:2.16 which are normal at this gestational age.  -Absent end-diastolic flow: No.  -Reversed end-diastolic flow:  No.  BPP 8/8.  Recommendations  - Sent to MAU for r/o ROM. If all testing for ROM is negative  deliery should occur in 2-3 days due to a large drop in the  estmiated weight, now FGR. If ROM is positive she should be  admitted for delivery.  There are limitations of prenatal ultrasound such as the  inability to detect certain abnormalities due to poor  visualization. Various factors such as fetal position,  gestational age and maternal body habitus may increase the  difficulty in visualizing the fetal anatomy. ----------------------------------------------------------------------                  Braxton Feathers, DO Electronically Signed Final Report   03/26/2023 01:22 pm ----------------------------------------------------------------------   [place] MFM FETAL BPP WO NON STRESS Result Date: 03/26/2023 ----------------------------------------------------------------------  OBSTETRICS REPORT                       (Signed Final 03/26/2023 01:22 pm) ---------------------------------------------------------------------- Patient Info  ID #:       [id number]                          D.O.B.:  Apr 16, 1990 (32 yrs)(F)   Name:       [name]            Visit Date: 03/26/2023 11:39 am ---------------------------------------------------------------------- Performed By  Attending:        Braxton Feathers DO       Ref. Address:     [address]  Performed By:     Dennis Bast RDMS      [facility]  Referred By:      Venora Maples MD ---------------------------------------------------------------------- Orders  #  Description                           Code        Ordered By  1  [place] MFM OB FOLLOW UP                   84132.44    Braxton Feathers  2  [place] MFM UA CORD DOPPLER                76820.02    BURK SCHAIBLE  3  [place] MFM FETAL BPP WO NON               76819.01    [facility]     STRESS ----------------------------------------------------------------------  #  Order #                     Accession #                Episode #  1  [id number]                   [id number]                 [id number]  2  [id number]                   [id number]                 [id number]  3  [id number]                   [id number]                 [id number] ---------------------------------------------------------------------- Indications  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 Cord Insertion:      Previously seen  Amniotic Fluid  AFI FV:      Within normal limits  AFI Sum(cm)     %Tile       Largest Pocket(cm)  7.74            7           4.05  RUQ(cm)       RLQ(cm)       LUQ(cm)        LLQ(cm)  0             4.05          1.1            2.59 ---------------------------------------------------------------------- Biophysical Evaluation  Amniotic F.V:   Within normal limits       F. Tone:        Observed  F. Movement:    Observed                   Score:          8/8  F. Breathing:   Observed ---------------------------------------------------------------------- Biometry  BPD:      84.2  mm     G. Age:  33w 6d        2.6  %    CI:        77.35   %    70 - 86                                                          FL/HC:      22.1   %    20.8 - 22.6  HC:      303.1  mm     G. Age:  33w 5d        < 1  %    HC/AC:      1.00        0.92 - 1.05  AC:      304.5  mm     G. Age:  34w 3d          5  %    FL/BPD:     79.7   %    71 - 87  FL:       67.1  mm     G. Age:  34w 4d          4  %    FL/AC:      22.0   %    20 - 24  Est. FW:    2398  gm      5 lb 5 oz      5  % ---------------------------------------------------------------------- OB History  Gravidity:    3         Term:   1        Prem:   1  Living:  2 ---------------------------------------------------------------------- Gestational Age  LMP:           37w 0d        Date:  07/10/22                   EDD:   04/16/23  U/S Today:     34w 1d                                        EDD:   05/06/23  Best:          37w 0d     Det. By:  LMP  (07/10/22)          EDD:   04/16/23 ---------------------------------------------------------------------- Anatomy  Cranium:               Previously seen        Aortic Arch:            Previously seen  Cavum:                 Previously seen        Ductal Arch:            Previously seen  Ventricles:            Appears normal         Diaphragm:               Appears normal  Choroid Plexus:        Previously seen        Stomach:                Appears normal, left                                                                        sided  Cerebellum:            Previously seen        Abdomen:                Previously seen  Posterior Fossa:       Previously seen        Abdominal Wall:         Previously seen  Face:                  Orbits and profile     Cord Vessels:           Previously seen                         previously seen  Lips:                  Previously seen        Kidneys:                Appear normal  Thoracic:              Previously seen        Bladder:                Appears normal  Heart:  Appears normal         Spine:                  Previously seen                         (4CH, axis, and                         situs)  RVOT:                  Previously seen        Upper Extremities:      Previously seen  LVOT:                  Previously seen        Lower Extremities:      Previously seen ---------------------------------------------------------------------- Doppler - Fetal Vessels  Umbilical Artery   S/D     %tile      RI    %tile                      PSV    ADFV    RDFV                                                     (cm/s)   2.16       37    0.54       40                     49.48      No      No ---------------------------------------------------------------------- Cervix Uterus Adnexa  Cervix  Not visualized (advanced GA >24wks)  Uterus  No abnormality visualized.  Right Ovary  Not visualized.  Left Ovary  Not visualized.  Cul De Sac  No free fluid seen.  Adnexa  No abnormality visualized ---------------------------------------------------------------------- Comments  The patient is here for a follow-up ultrasound for FGR  at 37w  0d. EDD: 04/16/2023. Dating: LMP  (07/10/22). She belives  she is losing amniotic fluid. I discussed my concern for  decreasing fetal growth percentiles and the need for possible  delivery if is  diagnosed with ROM. I discussed the concern for  placental insufficiency and stillbirth with FGR.  Sonographic findings  Single intrauterine pregnancy.  Fetal cardiac activity:  Observed and appears normal.  Presentation: Cephalic.  Interval fetal anatomy appears normal.  Fetal biometry shows the estimated fetal weight at the 5  percentile and the abdominal circumference at the 5th  percentile.  Amniotic fluid: Within normal limits.  MVP: 4.05 cm.  Placenta: Anterior.  Umbilical artery dopplers findings:  -S/D:2.16 which are normal at this gestational age.  -Absent end-diastolic flow: No.  -Reversed end-diastolic flow:  No.  BPP 8/8.  Recommendations  - Sent to MAU for r/o ROM. If all testing for ROM is negative  deliery should occur in 2-3 days due to a large drop in the  estmiated weight, now FGR. If ROM is positive she should be  admitted for delivery.  There are limitations of prenatal ultrasound such as the  inability to detect certain abnormalities due to poor  visualization. Various factors such as fetal position,  gestational age and maternal body habitus  may increase the  difficulty in visualizing the fetal anatomy. ----------------------------------------------------------------------                  Braxton Feathers, DO Electronically Signed Final Report   03/26/2023 01:22 pm ----------------------------------------------------------------------    MAU Course/MDM: Orders Placed This Encounter  Procedures   Rupture of Membrane (ROM) Plus   Fern Test   Discharge patient Discharge disposition: 01-Home or Self Care; Discharge patient date: 03/26/2023    No orders of the defined types were placed in this encounter.    NST reviewed and reactive No evidence of PTL or PPROM with cervix visually 1 cm, no pooling, ferning, and ROM + neg Consult Dr Charlotta Newton with assessment and findings IOL scheduled on 03/29/23 at [redacted]w[redacted]d for FGR 5% per MFM recommendation given significant change since prior US Kick counts  reviewed D/C home with return precautions    Assessment: 1. Encounter for suspected premature rupture of membranes, with rupture of membranes not found   2. [redacted] weeks gestation of pregnancy   3. Fetal growth restriction antepartum     Plan: Discharge home Labor precautions and fetal kick counts  Follow-up Information     [facility] Follow up on 03/29/2023.   Specialty: Obstetrics and Gynecology Why: The hospital will call you on March 23,2025. Wait for the call before coming in to the  hospital. Contact information: [address] [place] [address] [phone]               Allergies as of 03/26/2023       Reactions   Pork-derived Products Hives   Penicillins Rash   Has patient had a PCN reaction causing immediate rash, facial/tongue/throat swelling, SOB or lightheadedness with hypotension: No Has patient had a PCN reaction causing severe rash involving mucus membranes or skin necrosis: No Has patient had a PCN reaction that required hospitalization No Has patient had a PCN reaction occurring within the last 10 years: No If all of the above answers are "NO", then may proceed with Cephalosporin use.        Medication List     TAKE these medications    multivitamin-prenatal 27-0.8 MG Tabs tablet Take 1 tablet by mouth daily at 12 noon.        Sharen Counter Certified Nurse-Midwife 03/26/2023 9:52 PM

## 2023-03-27 ENCOUNTER — Inpatient Hospital Stay (HOSPITAL_COMMUNITY)
Admission: RE | Admit: 2023-03-27 | Discharge: 2023-03-30 | DRG: 807 | Disposition: A | Discharge status: Home / Self Care | Attending: Obstetrics & Gynecology | Admitting: Obstetrics & Gynecology

## 2023-03-27 ENCOUNTER — Telehealth (HOSPITAL_COMMUNITY): Payer: Self-pay | Admitting: *Deleted

## 2023-03-27 ENCOUNTER — Encounter (HOSPITAL_COMMUNITY): Payer: Self-pay | Admitting: Family Medicine

## 2023-03-27 ENCOUNTER — Encounter (HOSPITAL_COMMUNITY): Payer: Self-pay | Admitting: *Deleted

## 2023-03-27 DIAGNOSIS — O471 False labor at or after 37 completed weeks of gestation: Secondary | ICD-10-CM | POA: Diagnosis not present

## 2023-03-27 DIAGNOSIS — Z880 Allergy status to penicillin: Secondary | ICD-10-CM | POA: Diagnosis not present

## 2023-03-27 DIAGNOSIS — O365990 Maternal care for other known or suspected poor fetal growth, unspecified trimester, not applicable or unspecified: Principal | ICD-10-CM | POA: Diagnosis present

## 2023-03-27 DIAGNOSIS — Z758 Other problems related to medical facilities and other health care: Secondary | ICD-10-CM | POA: Diagnosis present

## 2023-03-27 DIAGNOSIS — Z603 Acculturation difficulty: Secondary | ICD-10-CM | POA: Diagnosis present

## 2023-03-27 DIAGNOSIS — Z3A37 37 weeks gestation of pregnancy: Secondary | ICD-10-CM

## 2023-03-27 DIAGNOSIS — O365930 Maternal care for other known or suspected poor fetal growth, third trimester, not applicable or unspecified: Principal | ICD-10-CM | POA: Diagnosis present

## 2023-03-27 DIAGNOSIS — Z0371 Encounter for suspected problem with amniotic cavity and membrane ruled out: Secondary | ICD-10-CM | POA: Diagnosis not present

## 2023-03-27 DIAGNOSIS — O368130 Decreased fetal movements, third trimester, not applicable or unspecified: Secondary | ICD-10-CM | POA: Diagnosis present

## 2023-03-27 LAB — STREP GP B CULTURE+RFLX: Strep Gp B Culture+Rflx: NEGATIVE

## 2023-03-27 LAB — CBC
HCT: 41.9 % (ref 36.0–46.0)
Hemoglobin: 13.7 g/dL (ref 12.0–15.0)
MCH: 28.8 pg (ref 26.0–34.0)
MCHC: 32.7 g/dL (ref 30.0–36.0)
MCV: 88.2 fL (ref 80.0–100.0)
Platelets: 238 10*3/uL (ref 150–400)
RBC: 4.75 MIL/uL (ref 3.87–5.11)
RDW: 16.3 % — ABNORMAL HIGH (ref 11.5–15.5)
WBC: 8.8 10*3/uL (ref 4.0–10.5)
nRBC: 0 % (ref 0.0–0.2)

## 2023-03-27 LAB — TYPE AND SCREEN
ABO/RH(D): O POS
Antibody Screen: NEGATIVE

## 2023-03-27 LAB — GROUP B STREP BY PCR: Group B strep by PCR: NEGATIVE

## 2023-03-27 LAB — HIV ANTIBODY (ROUTINE TESTING W REFLEX): HIV Screen 4th Generation wRfx: NONREACTIVE

## 2023-03-27 MED ORDER — ONDANSETRON HCL 4 MG/2ML IJ SOLN: 4.0000 mg | Freq: Four times a day (QID) | INTRAMUSCULAR | Status: DC | PRN

## 2023-03-27 MED ORDER — OXYTOCIN-SODIUM CHLORIDE 30-0.9 UT/500ML-% IV SOLN: 2.5000 [IU]/h | INTRAVENOUS | Status: DC

## 2023-03-27 MED ORDER — TERBUTALINE SULFATE 1 MG/ML IJ SOLN
0.2500 mg | Freq: Once | INTRAMUSCULAR | Status: AC | PRN
  Administered 2023-03-28: 0.25 mg via SUBCUTANEOUS
  Filled 2023-03-27: qty 1

## 2023-03-27 MED ORDER — LIDOCAINE HCL (PF) 1 % IJ SOLN: 30.0000 mL | INTRAMUSCULAR | Status: DC | PRN

## 2023-03-27 MED ORDER — LACTATED RINGERS IV SOLN: INTRAVENOUS | Status: DC

## 2023-03-27 MED ORDER — OXYCODONE-ACETAMINOPHEN 5-325 MG PO TABS: 1.0000 | ORAL_TABLET | ORAL | Status: DC | PRN

## 2023-03-27 MED ORDER — OXYTOCIN-SODIUM CHLORIDE 30-0.9 UT/500ML-% IV SOLN
1.0000 m[IU]/min | INTRAVENOUS | Status: DC
  Administered 2023-03-27: 1 m[IU]/min via INTRAVENOUS
  Filled 2023-03-27: qty 500

## 2023-03-27 MED ORDER — OXYCODONE-ACETAMINOPHEN 5-325 MG PO TABS: 2.0000 | ORAL_TABLET | ORAL | Status: DC | PRN

## 2023-03-27 MED ORDER — SOD CITRATE-CITRIC ACID 500-334 MG/5ML PO SOLN: 30.0000 mL | ORAL | Status: DC | PRN

## 2023-03-27 MED ORDER — LACTATED RINGERS IV SOLN
500.0000 mL | INTRAVENOUS | Status: DC | PRN
Start: 2023-03-27 — End: 2023-03-28

## 2023-03-27 MED ORDER — OXYTOCIN BOLUS FROM INFUSION
333.0000 mL | Freq: Once | INTRAVENOUS | Status: AC
  Administered 2023-03-28: 333 mL via INTRAVENOUS

## 2023-03-27 MED ORDER — ACETAMINOPHEN 325 MG PO TABS: 650.0000 mg | ORAL_TABLET | ORAL | Status: DC | PRN

## 2023-03-27 NOTE — Progress Notes (Signed)
 Labor Progress Note Sherri [name] is a 33 y.o. [id number] at [redacted]w[redacted]d presented for IOL due to FGR  S: Pt on side of bed, talking through ctx  O:  BP 119/77 (BP Location: Left Arm)   Pulse 84   Temp 98.4 F (36.9 C) (Oral)   Resp 16   Ht 5' (1.524 m)   Wt 81.2 kg   LMP 07/10/2022   BMI 34.96 kg/m  EFM: 140/mod/+a/-d  CVE: Dilation: 1.5 Effacement (%): 50 Station: Ballotable Presentation: Vertex Exam by:: Dr. Judd Lien   A&P: 33 y.o. [id number] 104w1d here for IOL due to FGR  #Labor: Good ctx pattern on 2 of Pit, baby tolerating well   #Pain: Unmedicated #FWB: Cat I #GBS negative  #FGR: EFW 2398g (5%tile), AC 5%tile   [name], MD 10:22 PM

## 2023-03-27 NOTE — H&P (Signed)
 OBSTETRIC ADMISSION HISTORY AND PHYSICAL  Sherri [name] is a 33 y.o. female [phone] with IUP at [redacted]w[redacted]d by LMP presenting for IOL due to FGR with AC 5%; HC <1%. She reports +FMs, No LOF, no VB, no blurry vision, headaches or peripheral edema, and RUQ pain.  She plans on both breast and bottle feeding. She request depo-prevera for birth control. She received her prenatal care at [facility].  Dating: By LMP --->  Estimated Date of Delivery: 04/16/23  Sono:    @[redacted]w[redacted]d , CWD, normal anatomy, cephalic presentation, 2308 g, 5% EFW   Prenatal History/Complications:  #Fetal growth restriction  #Hx preterm delivery    Past Medical History: Past Medical History:  Diagnosis Date   Abscess of breast, antepartum    not during pregnancy or breast feeeding drainged and antibiotics resolved no surgery   Medical history non-contributory    Preterm labor     Past Surgical History: Past Surgical History:  Procedure Laterality Date   NO PAST SURGERIES      Obstetrical History: OB History     Gravida  3   Para  2   Term  1   Preterm  1   AB  0   Living  2      SAB  0   IAB  0   Ectopic  0   Multiple  0   Live Births  2           Social History Social History   Socioeconomic History   Marital status: Married    Spouse name: Not on file   Number of children: 1   Years of education: Not on file   Highest education level: High school graduate  Occupational History   Not on file  Tobacco Use   Smoking status: Never   Smokeless tobacco: Never  Vaping Use   Vaping status: Never Used  Substance and Sexual Activity   Alcohol use: No   Drug use: No   Sexual activity: Yes    Birth control/protection: None  Other Topics Concern   Not on file  Social History Narrative   Not on file   Social Drivers of Health   Financial Resource Strain: Not on file  Food Insecurity: No Food Insecurity (10/07/2022)   Hunger Vital Sign    Worried About Running Out  of Food in the Last Year: Never true    Ran Out of Food in the Last Year: Never true  Transportation Needs: No Transportation Needs (10/07/2022)   PRAPARE - Administrator, Civil Service (Medical): No    Lack of Transportation (Non-Medical): No  Physical Activity: Not on file  Stress: Not on file  Social Connections: Not on file    Family History: Family History  Problem Relation Age of Onset   Diabetes Father    Diabetes Paternal Grandmother     Allergies: Allergies  Allergen Reactions   Pork-Derived Products Hives   Penicillins Rash    Has patient had a PCN reaction causing immediate rash, facial/tongue/throat swelling, SOB or lightheadedness with hypotension: No Has patient had a PCN reaction causing severe rash involving mucus membranes or skin necrosis: No Has patient had a PCN reaction that required hospitalization No Has patient had a PCN reaction occurring within the last 10 years: No If all of the above answers are "NO", then may proceed with Cephalosporin use.     Medications Prior to Admission  Medication Sig Dispense Refill Last Dose/Taking  Prenatal Vit-Fe Fumarate-FA (MULTIVITAMIN-PRENATAL) 27-0.8 MG TABS tablet Take 1 tablet by mouth daily at 12 noon.        Review of Systems   All systems reviewed and negative except as stated in HPI  Last menstrual period 07/10/2022, currently breastfeeding. General appearance: alert, cooperative, appears stated age, and no distress Lungs: clear to auscultation bilaterally Heart: regular rate and rhythm Abdomen: soft, non-tender; bowel sounds normal Pelvic: see cervical exam Extremities: Homans sign is negative, no sign of DVT Presentation: cephalic Fetal monitoringBaseline: 140 bpmmoderate variability, acceleration, no decelerations Uterine activityFrequency: Every 7-9 minutes     Prenatal labs: ABO, Rh: O/Positive/-- (09/25 1023) Antibody: Negative (09/25 1023) Rubella: 2.10 (09/25 1023) RPR: Non  Reactive (01/13 0828)  HBsAg: Negative (09/25 1023)  HIV: Non Reactive (01/13 0828)  GBS:   36 week GBS pending    Lab Results  Component Value Date   GBS Negative 01/28/2021   GTT WNL Genetic screening  LR F Anatomy US f/u for missing views  Immunization History  Administered Date(s) Administered   Influenza,inj,Quad PF,6+ Mos 10/29/2020   Tdap 07/12/2015, 12/03/2020, 01/19/2023    Prenatal Transfer Tool  Maternal Diabetes: No Genetic Screening: Normal Maternal Ultrasounds/Referrals: IUGR Fetal Ultrasounds or other Referrals:  Referred to Materal Fetal Medicine  Maternal Substance Abuse:  No Significant Maternal Medications:  None Significant Maternal Lab Results: None36 week gbs pending.Collected PCR at admission/ Number of Prenatal Visits:greater than 3 verified prenatal visits Maternal Vaccinations:TDap, Flu, and Covid Other Comments:  None   Results for orders placed or performed during the hospital encounter of 03/26/23 (from the past 24 hours)  Rupture of Membrane (ROM) Plus   Collection Time: 03/26/23  8:12 PM  Result Value Ref Range   Rom Plus NEGATIVE   Fern Test   Collection Time: 03/26/23  8:20 PM  Result Value Ref Range   POCT Fern Test Negative = intact amniotic membranes     Patient Active Problem List   Diagnosis Date Noted   Fetal growth restriction antepartum 03/27/2023   History of fetal abnormality in previous pregnancy, currently pregnant 01/28/2023   Poor fetal growth affecting management of mother in third trimester 01/19/2023   Supervision of high risk pregnancy, antepartum 10/01/2022   History of preterm delivery, currently pregnant 07/18/2020   Language barrier 07/18/2020    Assessment/Plan:  Sherri [name] is a 33 y.o. [id number] at [redacted]w[redacted]d here for IOL due to FGR with EFW 5%. AC 5% and HC <1%. Pt reports she feels she has been "leaking fluid". Presented to MAU yesterday to r/o ROM. Cervix was closed with no pooling, ferning and ROM +  negative.   #Labor:Not in active labor. Avoid cytotec given FGR with HC <1%. Pitocin 1x1. Will try placing foley balloon. #Pain: Well controlled. Epidural available.  #FWB: reactive #GBS status:  negative #Feeding: Breastmilk  and Formula #Reproductive Life planning: Depo Provera #Circ:  not applicable  Denton Ar, MD  03/27/2023, 6:24 PM

## 2023-03-27 NOTE — Progress Notes (Signed)
 Patient information  Patient Name: Sherri [name]  Patient MRN:   [id number]  Referring practice: MFM Referring Provider: [facility])  MFM CONSULT  Sherri [name] is a 33 y.o. [phone] at [redacted]w[redacted]d here for ultrasound and consultation. Patient Active Problem List   Diagnosis Date Noted   History of fetal abnormality in previous pregnancy, currently pregnant 01/28/2023   Poor fetal growth affecting management of mother in third trimester 01/19/2023   Supervision of high risk pregnancy, antepartum 10/01/2022   History of preterm delivery, currently pregnant 07/18/2020   Language barrier 07/18/2020   Sherri [name] is here for a follow-up ultrasound for FGR at [redacted]w[redacted]d. EDD: 04/16/2023. Dating: LMP  (07/10/22). She believes she is losing amniotic fluid. I discussed my concern for decreasing fetal growth percentiles and the need for possible delivery if is diagnosed with ROM. I discussed the concern for placental insufficiency and stillbirth with FGR.    Sonographic findings  Single intrauterine pregnancy.  Fetal cardiac activity:  Observed and appears normal.  Presentation: Cephalic.  Interval fetal anatomy appears normal.  Fetal biometry shows the estimated fetal weight at the 5  percentile and the abdominal circumference at the 5th  percentile.  Amniotic fluid: Within normal limits.  MVP: 4.05 cm.  Placenta: Anterior.  Umbilical artery dopplers findings:  -S/D:2.16 which are normal at this gestational age.  -Absent end-diastolic flow: No.  -Reversed end-diastolic flow:  No.  BPP 8/8.    Recommendations  - Sent to MAU for r/o ROM. If all testing for ROM is negative delivery should occur anytime within the next 2-3 days due to a large drop in the estimated weight, now FGR. If ROM is positive she should be admitted for delivery.    There are limitations of prenatal ultrasound such as the  inability to detect certain abnormalities due to  poor  visualization. Various factors such as fetal position,  gestational age and maternal body habitus may increase the  difficulty in visualizing the fetal anatomy.   In in person interpreter was used for today's visit.   Review of Systems: A review of systems was performed and was negative except per HPI   Vitals and Physical Exam    03/26/2023   10:12 PM 03/26/2023    6:45 PM 03/26/2023    6:43 PM  Vitals with BMI  Systolic 126 119   Diastolic 81 83   Pulse 78 84 87    Sitting comfortably on the sonogram table Nonlabored breathing Normal rate and rhythm Abdomen is nontender  Past pregnancies OB History  Gravida Para Term Preterm AB Living  3 2 1 1  0 2  SAB IAB Ectopic Multiple Live Births  0 0 0 0 2    # Outcome Date GA Lbr Len/2nd Weight Sex Type Anes PTL Lv  3 Current           2 Term 02/22/21 [redacted]w[redacted]d  7 lb 7 oz (3.374 kg) F Vag-Spont EPI  LIV  1 Preterm 07/11/15 [redacted]w[redacted]d 08:37 / 00:17 5 lb 14.6 oz (2.682 kg) M Vag-Spont EPI  LIV     Birth Comments: left ring and pinky finger fused together , PROM    I spent 20 minutes reviewing the patients chart, including labs and images as well as counseling the patient about her medical conditions. Greater than 50% of the time was spent in direct face-to-face patient counseling.  Braxton Feathers, DO Maternal fetal medicine, [employer]   03/27/2023  8:48  AM

## 2023-03-27 NOTE — Progress Notes (Signed)
 In person spanish interpreter, maria, at bedside.

## 2023-03-27 NOTE — Telephone Encounter (Signed)
 Preadmission screen Interpreter number [phone] Pt states she was told to come to the hospital today for induction.  Reinforced these instructions and pt states she is on her way.

## 2023-03-27 NOTE — Plan of Care (Signed)

## 2023-03-27 NOTE — Progress Notes (Addendum)
 Labor Progress Note Sherri [name] is a 33 y.o. [id number] at [redacted]w[redacted]d presented for IOL for FGR. EFW 5%, AC 5%, HC <1%. S: Pt resting comfortably in bed.   O:  BP 119/77   Pulse 85   Temp 98.2 F (36.8 C) (Oral)   Resp 16   Ht 5' (1.524 m)   Wt 81.2 kg   LMP 07/10/2022   BMI 34.96 kg/m  EFM: 140 bpm/moderate variability/  CVE: Dilation: 1.5 Effacement (%): 50 Station: Ballotable Presentation: Vertex Exam by:: Deel RN   A&P: 33 y.o. [id number] [redacted]w[redacted]d IOL for FGR.  #Labor: Pt not in active labor.Discussed R/B/A of FB placement, and verbal consent obtained. Placed foley balloon without difficulty and balloon filled with 60 cc of saline. Mom and baby tolerated well. Pitocin started 1x1 with cap 6.  #Pain:  #FWB: reactive #GBS  presumptive negative #Hx preterm labor    Denton Ar, MD 8:39 PM  Evaluation and management procedures were performed by the [facility] Medicine Resident under my supervision. I was immediately available for direct supervision, assistance and direction throughout this encounter.  I also confirm that I have verified the information documented in the resident's note, and that I have also personally reperformed the pertinent components of the physical exam and all of the medical decision making activities.  I have also made any necessary editorial changes.   Mittie Bodo, MD Family Medicine - Obstetrics Fellow

## 2023-03-28 ENCOUNTER — Encounter (HOSPITAL_COMMUNITY): Payer: Self-pay | Admitting: Family Medicine

## 2023-03-28 DIAGNOSIS — O365930 Maternal care for other known or suspected poor fetal growth, third trimester, not applicable or unspecified: Secondary | ICD-10-CM

## 2023-03-28 DIAGNOSIS — Z3A37 37 weeks gestation of pregnancy: Secondary | ICD-10-CM

## 2023-03-28 LAB — RPR: RPR Ser Ql: NONREACTIVE

## 2023-03-28 MED ORDER — PHENYLEPHRINE 80 MCG/ML (10ML) SYRINGE FOR IV PUSH (FOR BLOOD PRESSURE SUPPORT): 80.0000 ug | PREFILLED_SYRINGE | INTRAVENOUS | Status: DC | PRN

## 2023-03-28 MED ORDER — COCONUT OIL OIL: 1.0000 | TOPICAL_OIL | Status: DC | PRN

## 2023-03-28 MED ORDER — FENTANYL-BUPIVACAINE-NACL 0.5-0.125-0.9 MG/250ML-% EP SOLN: 12.0000 mL/h | EPIDURAL | Status: DC | PRN

## 2023-03-28 MED ORDER — DIBUCAINE (PERIANAL) 1 % EX OINT: 1.0000 | TOPICAL_OINTMENT | CUTANEOUS | Status: DC | PRN

## 2023-03-28 MED ORDER — WITCH HAZEL-GLYCERIN EX PADS: 1.0000 | MEDICATED_PAD | CUTANEOUS | Status: DC | PRN

## 2023-03-28 MED ORDER — ACETAMINOPHEN 325 MG PO TABS
650.0000 mg | ORAL_TABLET | ORAL | Status: DC | PRN
  Administered 2023-03-29 (×2): 650 mg via ORAL
  Filled 2023-03-28 (×2): qty 2

## 2023-03-28 MED ORDER — SENNOSIDES-DOCUSATE SODIUM 8.6-50 MG PO TABS
2.0000 | ORAL_TABLET | ORAL | Status: DC
  Administered 2023-03-28 – 2023-03-30 (×3): 2 via ORAL
  Filled 2023-03-28 (×3): qty 2

## 2023-03-28 MED ORDER — SIMETHICONE 80 MG PO CHEW: 80.0000 mg | CHEWABLE_TABLET | ORAL | Status: DC | PRN

## 2023-03-28 MED ORDER — PRENATAL MULTIVITAMIN CH
1.0000 | ORAL_TABLET | Freq: Every day | ORAL | Status: DC
  Administered 2023-03-28 – 2023-03-30 (×3): 1 via ORAL
  Filled 2023-03-28 (×2): qty 1

## 2023-03-28 MED ORDER — ONDANSETRON HCL 4 MG/2ML IJ SOLN: 4.0000 mg | INTRAMUSCULAR | Status: DC | PRN

## 2023-03-28 MED ORDER — IBUPROFEN 600 MG PO TABS
600.0000 mg | ORAL_TABLET | Freq: Four times a day (QID) | ORAL | Status: DC
  Administered 2023-03-28 – 2023-03-30 (×9): 600 mg via ORAL
  Filled 2023-03-28 (×9): qty 1

## 2023-03-28 MED ORDER — BENZOCAINE-MENTHOL 20-0.5 % EX AERO
1.0000 | INHALATION_SPRAY | CUTANEOUS | Status: DC | PRN
  Filled 2023-03-28: qty 56

## 2023-03-28 MED ORDER — MEDROXYPROGESTERONE ACETATE 150 MG/ML IM SUSP: 150.0000 mg | INTRAMUSCULAR | Status: DC | PRN

## 2023-03-28 MED ORDER — LACTATED RINGERS IV SOLN: 500.0000 mL | Freq: Once | INTRAVENOUS | Status: DC

## 2023-03-28 MED ORDER — TETANUS-DIPHTH-ACELL PERTUSSIS 5-2.5-18.5 LF-MCG/0.5 IM SUSY: 0.5000 mL | PREFILLED_SYRINGE | Freq: Once | INTRAMUSCULAR | Status: DC

## 2023-03-28 MED ORDER — DIPHENHYDRAMINE HCL 25 MG PO CAPS: 25.0000 mg | ORAL_CAPSULE | Freq: Four times a day (QID) | ORAL | Status: DC | PRN

## 2023-03-28 MED ORDER — DIPHENHYDRAMINE HCL 50 MG/ML IJ SOLN: 12.5000 mg | INTRAMUSCULAR | Status: DC | PRN

## 2023-03-28 MED ORDER — OXYCODONE HCL 5 MG PO TABS: 5.0000 mg | ORAL_TABLET | ORAL | Status: DC | PRN

## 2023-03-28 MED ORDER — ZOLPIDEM TARTRATE 5 MG PO TABS: 5.0000 mg | ORAL_TABLET | Freq: Every evening | ORAL | Status: DC | PRN

## 2023-03-28 MED ORDER — EPHEDRINE 5 MG/ML INJ: 10.0000 mg | INTRAVENOUS | Status: DC | PRN

## 2023-03-28 MED ORDER — ONDANSETRON HCL 4 MG PO TABS: 4.0000 mg | ORAL_TABLET | ORAL | Status: DC | PRN

## 2023-03-28 NOTE — Progress Notes (Signed)
 Labor Progress Note Sherri [name] is a 33 y.o. [id number] at [redacted]w[redacted]d presented for IOL due to FGR  S: In to recheck pt, pt reports more discomfort w ctx  O:  BP 105/72   Pulse 64   Temp 98 F (36.7 C) (Oral)   Resp 19   Ht 5' (1.524 m)   Wt 81.2 kg   LMP 07/10/2022   BMI 34.96 kg/m  EFM: 115/mod/+a/+prolonged decel (now resolved)  CVE: Dilation: 5.5 Effacement (%): 70 Station: -3 Presentation: Vertex Exam by:: Dr. Lucianne Muss   A&P: 33 y.o. [name] [redacted]w[redacted]d here for IOL due to FGR  #Labor: Discussed role of AROM w pt and she provided verbal consent. AROM performed with thick meconium and scant fluid. After AROM, FHR down to the 80s-90s. Proceeded w position changes, IV fluid bolus, and Pitocin paused, all with resolution of decels. Cervix rechecked and thinner than prior and no cord prolapse noted. Had prolonged decel -- 3-4 min total. Baby with moderate variability and strip overall reassuring following decel. Given dose of Terb to allow for fetal recovery.  #Pain: Unmedicated #FWB: Cat II, now  #GBS negative  #FGR: EFW 2398g (5%tile), AC 5%tile   [name], MD 6:49 AM

## 2023-03-28 NOTE — Lactation Note (Signed)
 This note was copied from a baby's chart. Lactation Consultation Note  Patient Name: Sherri [name] RUEAV'W Date: 03/28/2023 Age:31 hours Reason for consult: Follow-up assessment;Early term 37-38.6wks LC used House Research officer, [employer] for consult. Mom was concerned the baby hadn't eaten in a while. Baby was waking up when LC came into rm. Baby was spitting up a lot of clear watery fluid. Explained to mom this is why she isn't interested in BF because her abdomen is full. Mom stated OK she understood. Placed baby STS between mom's breast. Newborn feeding habits, STS, I&O, positioning and support reviewed. Mom encouraged to feed baby 8-12 times/24 hours and with feeding cues.  Encouraged to document feeding attempts and emesis.  Discussed body alignment during feedings and preventing nipple soreness by having good deep latches. Encouraged to call for assistance as needed. Maternal Data Has patient been taught Hand Expression?: Yes Does the patient have breastfeeding experience prior to this delivery?: Yes How long did the patient breastfeed?: 1 yr each  Feeding Mother's Current Feeding Choice: Breast Milk and Formula  LATCH Score Latch: Too sleepy or reluctant, no latch achieved, no sucking elicited.  Audible Swallowing: None  Type of Nipple: Everted at rest and after stimulation  Comfort (Breast/Nipple): Soft / non-tender  Hold (Positioning): Assistance needed to correctly position infant at breast and maintain latch.  LATCH Score: 5   Lactation Tools Discussed/Used    Interventions Interventions: Breast feeding basics reviewed;Assisted with latch;Skin to skin;Breast massage;Hand express;Breast compression;Adjust position;Support pillows;Position options;Education  Discharge    Consult Status Consult Status: Follow-up Date: 03/29/23 Follow-up type: In-patient    Charyl Dancer 03/28/2023, 9:25 PM

## 2023-03-28 NOTE — Lactation Note (Signed)
 This note was copied from a baby's chart. Lactation Consultation Note  Patient Name: Sherri [name] [id number]'Z Date: 03/28/2023 Age:31 hours Reason for consult: Initial assessment;Early term 37-38.6wks;Breastfeeding assistance Video interpreter Blossom Hoops [phone]) used to communicate with MOB in Spanish).  P3- MOB reports that infant has been nursing well, but she has been sleepy. LC reviewed the first 24 hr birthday nap and how it is normal for her to be a little sleepy. At this time, MOB had infant latched to the right breast in the cross cradle hold. Infant was placed belly to belly and nose to nipple. Infant was actively nursing without external stimulation. MOB reported that it hurts when infant nurses. LC could not figure out why it was hurting MOB. LC encouraged unlatching infant. Infant was content with MOB unlatching her and she had nursed for 15 minutes. Per MOB, her nipples hurt for a months after delivery with all of her children. Per MOB, she has sensitive breasts. LC encouraged MOB to call for infant's next feeding to make sure something is causing this pain vs it just being maternal hyper-sensitivity.  LC reviewed the first 24 hr birthday nap, day 2 cluster feeding, feeding infant on cue 8-12x in 24 hrs, not allowing infant to go over 3 hrs without a feeding, CDC milk storage guidelines and LC services handout. LC encouraged MOB to call for further assistance as needed.  Maternal Data Has patient been taught Hand Expression?: Yes Does the patient have breastfeeding experience prior to this delivery?: Yes How long did the patient breastfeed?: 1 year for both older children  Feeding Mother's Current Feeding Choice: Breast Milk and Formula  LATCH Score Latch: Grasps breast easily, tongue down, lips flanged, rhythmical sucking.  Audible Swallowing: A few with stimulation  Type of Nipple: Everted at rest and after stimulation  Comfort (Breast/Nipple): Soft /  non-tender  Hold (Positioning): No assistance needed to correctly position infant at breast.  LATCH Score: 9   Lactation Tools Discussed/Used Pump Education: Milk Storage  Interventions Interventions: Breast feeding basics reviewed;Assisted with latch;Breast compression;Adjust position;Support pillows;Position options;Education;LC Services brochure  Discharge Discharge Education: Engorgement and breast care;Warning signs for feeding baby Pump: Manual;Personal  Consult Status Consult Status: Follow-up Date: 03/29/23 Follow-up type: In-patient    Dema Severin BS, IBCLC 03/28/2023, 4:05 PM

## 2023-03-28 NOTE — Discharge Summary (Signed)
 Postpartum Discharge Summary  Date of Service updated***     Patient Name: Sherri [name] DOB: Aug 04, 1990 MRN: [id number]  Date of admission: 03/27/2023 Delivery date:03/28/2023 Delivering provider: Cam Hai D Date of discharge: 03/28/2023  Admitting diagnosis: Fetal growth restriction antepartum [O36.5990] Intrauterine pregnancy: [redacted]w[redacted]d     Secondary diagnosis:  Principal Problem:   Fetal growth restriction antepartum Active Problems:   Language barrier  Additional problems: none    Discharge diagnosis: Term Pregnancy Delivered                                              Post partum procedures: offered Depo  *** Augmentation: AROM, Pitocin, and IP Foley Complications: None  Hospital course: Induction of Labor With Vaginal Delivery   33 y.o. yo [phone] at [redacted]w[redacted]d was admitted to the hospital 03/27/2023 for induction of labor.  Indication for induction:  FGR (EFW5%, HC<1%) .  Patient had an uncomplicated labor course. Membrane Rupture Time/Date: 6:24 AM,03/28/2023  Delivery Method:Vaginal, Spontaneous Operative Delivery:N/A Episiotomy: None Lacerations:  None Details of delivery can be found in separate delivery note.  Patient had an uncomplicated postpartum course.  Patient is discharged home 03/28/23.  Newborn Data: Birth date:03/28/2023 Birth time:8:51 AM Gender:Female Living status:Living Apgars:8 ,9  Weight:2730 g (6lb 0.3oz)  Magnesium Sulfate received: No BMZ received: No Rhophylac:N/A MMR:N/A T-DaP:Given prenatally Flu: Yes RSV Vaccine received: No Transfusion:No  Immunizations received: Immunization History  Administered Date(s) Administered   Influenza,inj,Quad PF,6+ Mos 10/29/2020   Tdap 07/12/2015, 12/03/2020, 01/19/2023    Physical exam  Vitals:   03/28/23 1045 03/28/23 1100 03/28/23 1545 03/28/23 2040  BP: 108/80 107/82 121/86 109/72  Pulse: 71 77 87 84  Resp: 18 17 18 18   Temp: 98 F (36.7 C) 97.8 F (36.6 C)  97.6 F (36.4 C)   TempSrc: Oral Oral  Oral  SpO2: 99% 99% 99% 99%  Weight:      Height:       General: alert and cooperative Lochia: appropriate Uterine Fundus: firm Incision: N/A DVT Evaluation: No evidence of DVT seen on physical exam. Labs: Lab Results  Component Value Date   WBC 8.8 03/27/2023   HGB 13.7 03/27/2023   HCT 41.9 03/27/2023   MCV 88.2 03/27/2023   PLT 238 03/27/2023       No data to display         [place] Score:    03/28/2023   11:45 AM  Edinburgh Postnatal Depression Scale Screening Tool  I have been able to laugh and see the funny side of things. 0  I have looked forward with enjoyment to things. 1  I have blamed myself unnecessarily when things went wrong. 0  I have been anxious or worried for no good reason. 2  I have felt scared or panicky for no good reason. 2  Things have been getting on top of me. 0  I have been so unhappy that I have had difficulty sleeping. 0  I have felt sad or miserable. 1  I have been so unhappy that I have been crying. 1  The thought of harming myself has occurred to me. 0  Edinburgh Postnatal Depression Scale Total 7   Edinburgh Postnatal Depression Scale Total: 7   After visit meds:  Allergies as of 03/28/2023       Reactions   Pork-derived Products Hives  Penicillins Rash   Has patient had a PCN reaction causing immediate rash, facial/tongue/throat swelling, SOB or lightheadedness with hypotension: No Has patient had a PCN reaction causing severe rash involving mucus membranes or skin necrosis: No Has patient had a PCN reaction that required hospitalization No Has patient had a PCN reaction occurring within the last 10 years: No If all of the above answers are "NO", then may proceed with Cephalosporin use.     Med Rec must be completed prior to using this [facility]***        Discharge home in stable condition Infant Feeding: {Baby feeding:23562} Infant Disposition:{CHL IP OB HOME WITH NWGNFA:21308} Discharge  instruction: per After Visit Summary and Postpartum booklet. Activity: Advance as tolerated. Pelvic rest for 6 weeks.  Diet: routine diet Future Appointments: Future Appointments  Date Time Provider Department Center  03/31/2023  3:55 PM Leafy Half [facility]  04/08/2023  3:15 PM Sue Lush, FNP [facility]  04/14/2023  3:15 PM  Bing, MD [facility]  04/21/2023  3:55 PM Conan Bowens, MD [facility]   Follow up Visit:  Arabella Merles, CNM  P [facility] Admin Pool Please schedule this patient for Postpartum visit in: 6 weeks with the following provider: Any provider In-Person For C/S patients schedule nurse incision check in weeks 2 weeks: no High risk pregnancy complicated by: FGR Delivery mode:  SVD Anticipated Birth Control:  (Depo will be offered inpt) PP Procedures needed: none Schedule Integrated BH visit: no   03/28/2023 Arabella Merles, CNM

## 2023-03-29 ENCOUNTER — Inpatient Hospital Stay (HOSPITAL_COMMUNITY)

## 2023-03-29 MED ORDER — IBUPROFEN 600 MG PO TABS: 600.0000 mg | ORAL_TABLET | Freq: Four times a day (QID) | ORAL | 0 refills | Status: AC | PRN

## 2023-03-29 NOTE — Progress Notes (Addendum)
 Post Partum Day #1 Subjective: no complaints, up ad lib, and tolerating PO; breast and bottle feeding; desires Depo outpatient  Objective: Blood pressure 110/76, pulse 80, temperature 97.8 F (36.6 C), temperature source Oral, resp. rate 18, height 5' (1.524 m), weight 81.2 kg, last menstrual period 07/10/2022, SpO2 99%, unknown if currently breastfeeding.  Physical Exam:  General: alert, cooperative, and no distress Lochia: appropriate Uterine Fundus: firm DVT Evaluation: No evidence of DVT seen on physical exam.  Recent Labs    03/27/23 1834  HGB 13.7  HCT 41.9    Assessment/Plan: Discharge home per her request, as long as the baby can go as well (see Discharge Summary).   LOS: 2 days   Sherri [name], CNM 03/29/2023, 8:51 AM

## 2023-03-29 NOTE — Lactation Note (Signed)
 This note was copied from a baby's chart. Lactation Consultation Note  Patient Name: Sherri [name] [id number] Date: 03/29/2023 Age:31 hours Reason for consult: Difficult latch;Follow-up assessment (weight loss -7.69%).  In house Spanish Interpreter used :Raquel  C/O : MOB infant is not latching at the breast, attempts have been made and  infant had 3 episodes of emesis today, that  is slowing improving,  per FOB.   LC unable to assist with latch due infant recently receiving 5 mls of EBM that MOB expressed with using hand pump and 20 mls of 20 kcal formula using a slow flow bottle nipple. Per MOB infant latched but did not elicit the suck and swallow response only help her nipple in her mouth with last feeding.  LC set MOB up with DEBP using a 24 mm breast flange and MOB was pumping as LC left the room.  MOB understands that her EBM is safe for 4 hours whereas formula once open must be used within 1 hour. Handouts give in Spanish regarding "Feeding Amounts", Storage and Preparation of Breast Milk and CDC Cleaning of Pump Parts."  Current feeding plan: 1- MOB will continue to latch infant first for every feeding, feed by cues, on demand, every 2-3 hours. 2- MOB knows to supplement infant with any pumped EBM first and then formula, MOB will continue to supplement infant until infant is latching well at the breast and has regained birth weight. 3- MOB will continue to use the DEBP every 3 hours, for 15 minutes on initial setting.  Maternal Data Has patient been taught Hand Expression?: Yes Does the patient have breastfeeding experience prior to this delivery?: Yes  Feeding Mother's Current Feeding Choice: Breast Milk and Formula  LATCH Score  LC did not observe latch due infant recently consume 25 mls of ( 5 mls of EBM and 20 mls of formula).                   Lactation Tools Discussed/Used Tools: Flanges;Pump Flange Size: 24 Breast pump type: Double-Electric Breast  Pump Pump Education: Setup, frequency, and cleaning;Milk Storage Reason for Pumping: Infant not latching at the breast, to help with breast stimulation and establishing MOB's milk supply. Pumping frequency: MOB will continue to use DEBP every 3 hours for 15 minutes on inital settting.  Interventions Interventions: Expressed milk;DEBP;Education;Pace feeding;Guidelines for Milk Supply and Pumping Schedule Handout;CDC milk storage guidelines;CDC Guidelines for Breast Pump Cleaning  Discharge    Consult Status Consult Status: Follow-up Date: 03/30/23 Follow-up type: In-patient    Frederico Hamman 03/29/2023, 5:01 PM

## 2023-03-29 NOTE — Progress Notes (Signed)
 RN used Sales promotion account executive.

## 2023-03-30 NOTE — Lactation Note (Signed)
 This note was copied from a baby's chart. Lactation Consultation Note  Patient Name: Sherri [name] [id number] Date: 03/30/2023 Age:31 hours - [facility] Spanish interpreter present  Reason for consult: Follow-up assessment;Early term 37-38.6wks;Infant weight loss;Mother's request;MD order;Breastfeeding assistance (8 % weight loss) Baby awake and rooting after the NP exam. LC offered to assist and mom receptive. 1st LC reviewed hand expressing , large drop of mo;k noted .  LC placed baby STS on the right breast, football and baby latched with ease and depth, increased swallows with breast compressions.  LC reviewed 24 hour feeding goals- to feed with cues and by 3 hours offer the breast. Due to the weight loss of 8 %, if the baby is sluggish to start feed the supplement of EBM 1st and then latch.  Feed for 15 -20 mins , supplement working ip to 30 ml and after the baby is settled, post pump both breast for 15 mins and save the milk for the next  feeding. Next feeding switch to the other breast and do the same.  LC reassured mom once the weight loss is less she will be able to give the baby more time at the breast.  LC reviewed breast feeding D/C teaching and offered to request and LC appt. Mom receptive to [facility] O/P and aware she will receive a call.   Reweigh check for this afternoon - per NP if no additional weight loss will be D/C.    Maternal Data Has patient been taught Hand Expression?: Yes  Feeding Mother's Current Feeding Choice: Breast Milk and Formula Nipple Type: Slow - flow  LATCH Score Latch: Grasps breast easily, tongue down, lips flanged, rhythmical sucking.  Audible Swallowing: Spontaneous and intermittent  Type of Nipple: Everted at rest and after stimulation  Comfort (Breast/Nipple): Filling, red/small blisters or bruises, mild/mod discomfort  Hold (Positioning): Assistance needed to correctly position infant at breast and maintain latch.  LATCH Score:  8   Lactation Tools Discussed/Used Tools: Pump;Flanges Flange Size: 24 Breast pump type: Double-Electric Breast Pump;Manual Pump Education: Setup, frequency, and cleaning;Milk Storage Pumped volume: 10 mL  Interventions Interventions: Breast feeding basics reviewed;Assisted with latch;Skin to skin;Breast massage;Hand express;Reverse pressure;Breast compression;Adjust position;Support pillows;Position options;Hand pump;DEBP;Education;Pace feeding;LC Services brochure;CDC milk storage guidelines;CDC Guidelines for Breast Pump Cleaning  Discharge Discharge Education: Engorgement and breast care;Outpatient recommendation;Outpatient Epic message sent;Other (comment) (mom recessive and aware she will receive a call for [facility] Pump: Manual;Personal;DEBP  Consult Status Consult Status: Complete Date: 03/30/23    Kathrin Greathouse 03/30/2023, 10:32 AM

## 2023-03-30 NOTE — Discharge Summary (Signed)
 Postpartum Discharge Summary       Patient Name: Sherri [name] DOB: 09-Feb-1990 MRN: [id number]  Date of admission: 03/27/2023 Delivery date:03/28/2023 Delivering provider: Cam Hai D Date of discharge: 03/30/2023  Admitting diagnosis: Fetal growth restriction antepartum [O36.5990] Intrauterine pregnancy: [redacted]w[redacted]d     Secondary diagnosis:  Principal Problem:   Fetal growth restriction antepartum Active Problems:   Language barrier  Additional problems: none    Discharge diagnosis: Term Pregnancy Delivered                                              Post partum procedures: none Augmentation: AROM, Pitocin, and IP Foley Complications: None  Hospital course: Induction of Labor With Vaginal Delivery   33 y.o. yo [phone] at [redacted]w[redacted]d was admitted to the hospital 03/27/2023 for induction of labor.  Indication for induction:  FGR (EFW 5%ile, HC <1%)  .  Patient had an labor course complicated by none Membrane Rupture Time/Date: 6:24 AM,03/28/2023  Delivery Method:Vaginal, Spontaneous Operative Delivery:N/A Episiotomy: None Lacerations:  None Details of delivery can be found in separate delivery note.  Patient had a postpartum course complicated bynone. Patient is discharged home 03/30/23.  Newborn Data: Birth date:03/28/2023 Birth time:8:51 AM Gender:Female Living status:Living Apgars:8 ,9  Weight:2730 g  Magnesium Sulfate received: No BMZ received: No Rhophylac:N/A MMR:N/A T-DaP:Given prenatally Flu: Yes RSV Vaccine received: No Transfusion:No  Immunizations received: Immunization History  Administered Date(s) Administered   Influenza,inj,Quad PF,6+ Mos 10/29/2020   Tdap 07/12/2015, 12/03/2020, 01/19/2023    Physical exam  Vitals:   03/29/23 0027 03/29/23 0500 03/29/23 1800 03/29/23 2315  BP:  110/76 109/80 106/77  Pulse:  80 89 78  Resp:  18 18 18   Temp: 98.4 F (36.9 C) 97.8 F (36.6 C) 97.7 F (36.5 C) 98.2 F (36.8 C)  TempSrc: Oral Oral  Oral   SpO2:  99%    Weight:      Height:       General: alert, cooperative, and no distress Lochia: appropriate Uterine Fundus: firm Incision: N/A DVT Evaluation: No evidence of DVT seen on physical exam. Labs: Lab Results  Component Value Date   WBC 8.8 03/27/2023   HGB 13.7 03/27/2023   HCT 41.9 03/27/2023   MCV 88.2 03/27/2023   PLT 238 03/27/2023       No data to display         [place] Score:    03/28/2023   11:45 AM  Edinburgh Postnatal Depression Scale Screening Tool  I have been able to laugh and see the funny side of things. 0  I have looked forward with enjoyment to things. 1  I have blamed myself unnecessarily when things went wrong. 0  I have been anxious or worried for no good reason. 2  I have felt scared or panicky for no good reason. 2  Things have been getting on top of me. 0  I have been so unhappy that I have had difficulty sleeping. 0  I have felt sad or miserable. 1  I have been so unhappy that I have been crying. 1  The thought of harming myself has occurred to me. 0  Edinburgh Postnatal Depression Scale Total 7   No data recorded  After visit meds:  Allergies as of 03/30/2023       Reactions   Pork-derived Products Hives   Penicillins  Rash   Has patient had a PCN reaction causing immediate rash, facial/tongue/throat swelling, SOB or lightheadedness with hypotension: No Has patient had a PCN reaction causing severe rash involving mucus membranes or skin necrosis: No Has patient had a PCN reaction that required hospitalization No Has patient had a PCN reaction occurring within the last 10 years: No If all of the above answers are "NO", then may proceed with Cephalosporin use.        Medication List     TAKE these medications    ibuprofen 600 MG tablet Commonly known as: ADVIL Take 1 tablet (600 mg total) by mouth every 6 (six) hours as needed.   multivitamin-prenatal 27-0.8 MG Tabs tablet Take 1 tablet by mouth daily at 12 noon.          Discharge home in stable condition Infant Feeding: Bottle and Breast Infant Disposition:home with mother Discharge instruction: per After Visit Summary and Postpartum booklet. Activity: Advance as tolerated. Pelvic rest for 6 weeks.  Diet: routine diet Future Appointments: Future Appointments  Date Time Provider Department Center  03/31/2023  3:55 PM Leafy Half [facility]  04/08/2023  3:15 PM Sue Lush, FNP [facility]  04/14/2023  3:15 PM Farwell Bing, MD [facility]  04/21/2023  3:55 PM Conan Bowens, MD [facility]   Follow up Visit:   Arabella Merles, CNM  P [facility] Admin Pool Please schedule this patient for Postpartum visit in: 6 weeks with the following provider: Any provider In-Person For C/S patients schedule nurse incision check in weeks 2 weeks: no High risk pregnancy complicated by: FGR Delivery mode:  SVD Anticipated Birth Control:  (Depo-wants outpt) PP Procedures needed: none Schedule Integrated BH visit: no   03/30/2023 Richardson Landry, CNM

## 2023-03-30 NOTE — Progress Notes (Signed)
 Check on pt needs,ordered her lunch and snack by Orlan Leavens Spanish Medical Interpreter.

## 2023-03-31 ENCOUNTER — Encounter: Admitting: Certified Nurse Midwife

## 2023-04-08 ENCOUNTER — Encounter: Admitting: Obstetrics and Gynecology

## 2023-04-09 ENCOUNTER — Telehealth (HOSPITAL_COMMUNITY): Payer: Self-pay | Admitting: *Deleted

## 2023-04-09 NOTE — Telephone Encounter (Signed)
 04/09/2023  Name: Sherri [name] MRN: [id number] DOB: 11/18/1990  Reason for Call:  Transition of Care Hospital Discharge Call  Contact Status: Patient Contact Status: Complete  Language assistant needed: Interpreter Mode: Telephonic Interpreter Interpreter Name: Max [phone] Interpreter Phone Number - If applicable: Language Line [phone]        Follow-Up Questions: Do You Have Any Concerns About Your Health As You Heal From Delivery?: Yes What Concerns Do You Have About Your Health?: Patient asked, "Is it normal to still have pain and bleeding?" RN reassured patient that bleeding and discomfort are normal at this point in her recovery. RNreviewed warning signs to report to MD - none reported. No other questions or concerns voiced at this time. Do You Have Any Concerns About Your Infants Health?: Yes What Concerns Do You Have About Your Baby?: Patient reported, "We are having breastfeeding struggles. She prefers the bottle." Patient continued that she has been working with a Advertising copywriter. No other resources needed at this time.  Edinburgh Postnatal Depression Scale:  In the Past 7 Days: I have been able to laugh and see the funny side of things.: As much as I always could I have looked forward with enjoyment to things.: Rather less than I used to I have blamed myself unnecessarily when things went wrong.: Not very often I have been anxious or worried for no good reason.: No, not at all I have felt scared or panicky for no good reason.: No, not at all Things have been getting on top of me.: No, most of the time I have coped quite well I have been so unhappy that I have had difficulty sleeping.: Not at all I have felt sad or miserable.: Not very often I have been so unhappy that I have been crying.: Only occasionally The thought of harming myself has occurred to me.: Never Edinburgh Postnatal Depression Scale Total: 5  PHQ2-9 Depression Scale:     Discharge  Follow-up: Edinburgh score requires follow up?: No  Post-discharge interventions: Reviewed Newborn Safe Sleep Practices  Signature Deforest Hoyles, RN, 04/09/23, [phone]

## 2023-04-14 ENCOUNTER — Encounter: Payer: Self-pay | Admitting: Obstetrics and Gynecology

## 2023-04-21 ENCOUNTER — Encounter: Payer: Self-pay | Admitting: Obstetrics and Gynecology

## 2023-05-15 ENCOUNTER — Ambulatory Visit (INDEPENDENT_AMBULATORY_CARE_PROVIDER_SITE_OTHER): Admitting: Physician Assistant

## 2023-05-15 ENCOUNTER — Other Ambulatory Visit: Payer: Self-pay

## 2023-05-15 DIAGNOSIS — K5900 Constipation, unspecified: Secondary | ICD-10-CM

## 2023-05-15 MED ORDER — PHEXXI 1.8-1-0.4 % VA GEL: VAGINAL | 11 refills | Status: AC

## 2023-05-15 MED ORDER — POLYETHYLENE GLYCOL 3350 17 G PO PACK: 17.0000 g | PACK | Freq: Every day | ORAL | 0 refills | Status: AC

## 2023-05-15 NOTE — Progress Notes (Signed)
 Post Partum Visit Note  Sherri [name] is a 33 y.o. [phone] female who presents for a postpartum visit. She is 6 weeks postpartum following a normal spontaneous vaginal delivery.  I have fully reviewed the prenatal and intrapartum course. The delivery was at 37  gestational weeks.  Anesthesia: none. Postpartum course has been going well. Baby is doing well. Baby is feeding by both breast and bottle - Neosure. Bleeding staining only and red. Bowel function is abnormal: constipated. Bladder function is normal. Patient is not sexually active. Contraception method is none. Postpartum depression screening: negative.  Wouldlike depo, prefers ot have here.   The pregnancy intention screening data noted above was reviewed. Potential methods of contraception were discussed. The patient elected to proceed with No data recorded.    Health Maintenance Due  Topic Date Due   Cervical Cancer Screening (HPV/Pap Cotest)  08/18/2022   COVID-19 Vaccine (1 - 2024-25 season) Never done    The following portions of the patient's history were reviewed and updated as appropriate: allergies, current medications, past family history, past medical history, past social history, past surgical history, and problem list.  Review of Systems Pertinent items noted in HPI and remainder of comprehensive ROS otherwise negative.  Objective:  LMP 07/10/2022    General:  alert, cooperative, and appears stated age   Breasts:  normal  Lungs: clear to auscultation bilaterally  Heart:  regular rate and rhythm, S1, S2 normal, no murmur, click, rub or gallop  Abdomen: soft, non-tender; bowel sounds normal; no masses,  no organomegaly   Wound Not indicated  GU exam:  not indicated       Assessment:   1. Postpartum care and examination (Primary) Patient doing well, with lactation appointment with [facility] on Monday to address feeding issues with baby (having trouble with tongue/swallowing). She would like a  non-hormonal option for contraception today due to fear of gaining weight, and having past IUD imbedded in uterus. We had an extensive discussion about her options: - Reviewed different types of birth control available: OCPs, vaginal ring, Nexplanon, Depo, Phexxi, various types of IUDs. We reviewed the advantages and risks of each (particularly risk of VTE with estrogen containing options). We discussed side effects of each.  -We discussed that efficacy of Phexxi in preventing pregnancy is 93% with perfect use and lower otherwise. We discussed need to insert gel no more than 1 hour before intercourse, and that one applicator is only sufficient for one seminal emission.  - Reviewed that birth control does not protect against STI. Condoms reduce the risk of transmission but are not 100% effective especially for HSV and HIV. - Patient stated understanding of all the aforementioned items.  - Lactic Ac-Citric Ac-Pot Bitart (PHEXXI) 1.8-1-0.4 % GEL; Administer 1 applicator (5 g) intravaginally immediately before or up to 1 hour before EACH act of vaginal intercourse as needed.  Dispense: 60 g; Refill: 11  2. Constipation, unspecified constipation type - polyethylene glycol (MIRALAX) 17 g packet; Take 17 g by mouth daily.  Dispense: 14 each; Refill: 0   Plan:   Essential components of care per ACOG recommendations:  1.  Mood and well being: Patient with negative depression screening today. Reviewed local resources for support.  - Patient tobacco use? No.   - hx of drug use? No.    2. Infant care and feeding:  -Patient currently breastmilk feeding? Yes, has an appointment with [facility] on Monday for lactation and consultation about baby's tongue/swallowing.  -  Social determinants of health (SDOH) reviewed in EPIC. No concerns identified.   3. Sexuality, contraception and birth spacing - Patient does not want a pregnancy in the next year.   - Reviewed reproductive life planning. Reviewed  contraceptive methods based on pt preferences and effectiveness.  Patient desired Phexxi today due to preference for non-hormonal contraception; concerned for weight gain on other options, previous IUD implanted in uterus.  - Discussed birth spacing of 18 months  4. Sleep and fatigue -Encouraged family/partner/community support of 4 hrs of uninterrupted sleep to help with mood and fatigue  5. Physical Recovery  - Discussed patients delivery and complications. She describes her labor as good. - Patient had a Vaginal, no problems at delivery. Patient had no laceration. - Patient has urinary incontinence? No. - Patient is safe to resume physical and sexual activity  6.  Health Maintenance - HM due items addressed Yes - Last pap smear  Diagnosis  Date Value Ref Range Status  08/18/2019   Final   - Negative for intraepithelial lesion or malignancy (NILM)   Pap smear deferred per patient preference due to still spotting. I encouraged her to return for annual exam and cervical cancer screening once bleeding has stopped.   -Breast Cancer screening indicated? No.   7. Chronic Disease/Pregnancy Condition follow up: None  - PCP follow up  Pati Bonine, CMA Center for [facility], [facility]
